# Patient Record
Sex: Female | Born: 1950 | Race: White | Hispanic: No
Health system: Midwestern US, Community
[De-identification: ages and names within clinical notes are randomized; demographics above are authoritative.]

## PROBLEM LIST (undated history)

## (undated) DIAGNOSIS — E039 Hypothyroidism, unspecified: Secondary | ICD-10-CM

## (undated) DIAGNOSIS — E079 Disorder of thyroid, unspecified: Secondary | ICD-10-CM

## (undated) DIAGNOSIS — F1020 Alcohol dependence, uncomplicated: Secondary | ICD-10-CM

## (undated) DIAGNOSIS — D649 Anemia, unspecified: Secondary | ICD-10-CM

## (undated) DIAGNOSIS — J449 Chronic obstructive pulmonary disease, unspecified: Secondary | ICD-10-CM

## (undated) DIAGNOSIS — H409 Unspecified glaucoma: Secondary | ICD-10-CM

## (undated) DIAGNOSIS — I10 Essential (primary) hypertension: Secondary | ICD-10-CM

## (undated) DIAGNOSIS — F32A Depression, unspecified: Secondary | ICD-10-CM

## (undated) DIAGNOSIS — C50919 Malignant neoplasm of unspecified site of unspecified female breast: Secondary | ICD-10-CM

## (undated) DIAGNOSIS — F418 Other specified anxiety disorders: Secondary | ICD-10-CM

## (undated) HISTORY — DX: Unspecified glaucoma: H40.9

## (undated) HISTORY — PX: TOTAL ABDOMINAL HYSTERECTOMY: SHX209

## (undated) HISTORY — DX: Alcohol dependence, uncomplicated: F10.20

## (undated) HISTORY — DX: Disorder of thyroid, unspecified: E07.9

## (undated) HISTORY — DX: Hypothyroidism, unspecified: E03.9

## (undated) HISTORY — DX: Depression, unspecified: F32.A

## (undated) HISTORY — DX: Essential (primary) hypertension: I10

## (undated) HISTORY — DX: Malignant neoplasm of unspecified site of unspecified female breast: C50.919

## (undated) HISTORY — DX: Chronic obstructive pulmonary disease, unspecified: J44.9

---

## 1985-11-06 HISTORY — PX: MASTECTOMY: SHX3

## 2011-11-29 MED ORDER — CLARITHROMYCIN ER 500 MG PO TB24
500 MG | ORAL_TABLET | Freq: Every day | ORAL | Status: AC
Start: 2011-11-29 — End: 2011-12-13

## 2011-11-29 MED ORDER — PROMETHAZINE-CODEINE 6.25-10 MG/5ML PO SYRP
Freq: Every evening | ORAL | Status: AC | PRN
Start: 2011-11-29 — End: 2011-12-06

## 2011-11-29 NOTE — Progress Notes (Signed)
Subjective:      Patient ID: Kelli Cole is a 60 y.o. female.    Cough  This is a recurrent problem. The current episode started 1 to 4 weeks ago. The problem has been unchanged. The problem occurs constantly. The cough is non-productive. Associated symptoms include chest pain, ear congestion, nasal congestion, a sore throat and shortness of breath. Pertinent negatives include no chills, fever, myalgias, rash or wheezing. The treatment provided mild relief. Her past medical history is significant for COPD and pneumonia. There is no history of asthma.       Review of Systems   Constitutional: Negative for fever, chills, activity change and fatigue.   HENT: Positive for sore throat. Negative for congestion.    Respiratory: Positive for cough and shortness of breath. Negative for chest tightness and wheezing.    Cardiovascular: Positive for chest pain. Negative for leg swelling.   Gastrointestinal: Negative for abdominal pain.   Musculoskeletal: Negative for myalgias and back pain.   Skin: Negative for rash.   Psychiatric/Behavioral: Negative for behavioral problems and dysphoric mood.       Objective:   Physical Exam   Constitutional: She is oriented to person, place, and time. She appears well-developed and well-nourished. No distress.   HENT:   Head: Normocephalic and atraumatic.   Right Ear: External ear normal.   Left Ear: External ear normal.   Nose: Mucosal edema (and erythema) present.   Mouth/Throat: Posterior oropharyngeal edema and posterior oropharyngeal erythema present. No oropharyngeal exudate.   Eyes: Conjunctivae are normal. Pupils are equal, round, and reactive to light. Right eye exhibits no discharge. Left eye exhibits no discharge.   Neck: Normal range of motion. Neck supple.   Cardiovascular: Normal rate, regular rhythm and normal heart sounds.  Exam reveals no gallop and no friction rub.    No murmur heard.  Pulmonary/Chest: Effort normal and breath sounds normal. No stridor. No respiratory  distress. She has no wheezes. She has no rales.   Breath sounds generally diminished   Lymphadenopathy:     She has no cervical adenopathy.   Neurological: She is alert and oriented to person, place, and time.   Skin: Skin is warm and dry. No rash noted. She is not diaphoretic. No erythema.       Assessment:      Kelli Cole was seen today for cough.    Diagnoses and associated orders for this visit:    Bronchitis    COPD (chronic obstructive pulmonary disease)    Other Orders  - clarithromycin (BIAXIN XL) 500 MG XL tablet; Take 2 tablets by mouth daily for 14 days.  - promethazine-codeine (PHENERGAN WITH CODEINE) 6.25-10 MG/5ML syrup; Take 10 mLs by mouth nightly as needed for Cough for 7 days.               Plan:      ENT referral if not all clear at 3 week f-u.

## 2011-12-12 MED ORDER — PREDNISONE 10 MG PO TABS
10 MG | ORAL_TABLET | Freq: Every day | ORAL | Status: AC
Start: 2011-12-12 — End: 2011-12-17

## 2012-02-11 MED ORDER — TIOTROPIUM BROMIDE MONOHYDRATE 18 MCG IN CAPS
18 MCG | ORAL_CAPSULE | Freq: Every day | RESPIRATORY_TRACT | Status: DC
Start: 2012-02-11 — End: 2012-07-04

## 2012-02-11 MED ORDER — LEVOFLOXACIN 500 MG PO TABS
500 MG | ORAL_TABLET | Freq: Every day | ORAL | Status: AC
Start: 2012-02-11 — End: 2012-02-21

## 2012-02-11 MED ORDER — PROMETHAZINE-CODEINE 6.25-10 MG/5ML PO SYRP
Freq: Every evening | ORAL | Status: AC | PRN
Start: 2012-02-11 — End: 2012-02-18

## 2012-02-11 MED ORDER — PREDNISONE 10 MG PO TABS
10 MG | ORAL_TABLET | Freq: Every day | ORAL | Status: AC
Start: 2012-02-11 — End: 2012-02-16

## 2012-02-11 NOTE — Progress Notes (Signed)
Subjective:      Patient ID: Kelli Cole is a 61 y.o. female.    Cough  This is a recurrent problem. The current episode started 1 to 4 weeks ago. The problem has been gradually worsening. The problem occurs constantly. The cough is productive of purulent sputum. Associated symptoms include chest pain, ear congestion, nasal congestion and wheezing. Pertinent negatives include no fever or shortness of breath. Risk factors for lung disease include smoking/tobacco exposure. Treatments tried: quit flovent from late last year. Her past medical history is significant for asthma and COPD.   Wheezing   Associated symptoms include chest pain and coughing. Pertinent negatives include no fever or shortness of breath. Her past medical history is significant for asthma and COPD.       Review of Systems   Constitutional: Positive for activity change (exhausted again). Negative for fever.   Respiratory: Positive for cough and wheezing. Negative for shortness of breath.    Cardiovascular: Positive for chest pain.       Objective:   Physical Exam   Constitutional: She is oriented to person, place, and time. She appears well-developed and well-nourished. No distress.   HENT:   Head: Normocephalic and atraumatic.   Right Ear: External ear normal.   Left Ear: External ear normal.   Nose: Mucosal edema (and erythema) present.   Mouth/Throat: Posterior oropharyngeal edema and posterior oropharyngeal erythema present. No oropharyngeal exudate.   Eyes: Conjunctivae are normal. Pupils are equal, round, and reactive to light. Right eye exhibits no discharge. Left eye exhibits no discharge.   Neck: Normal range of motion. Neck supple.   Cardiovascular: Normal rate, regular rhythm and normal heart sounds.  Exam reveals no gallop and no friction rub.    No murmur heard.  Pulmonary/Chest: Effort normal. No stridor. No respiratory distress. She has wheezes (few with few rhonchi). She has no rales.   Lymphadenopathy:     She has no cervical  adenopathy.   Neurological: She is alert and oriented to person, place, and time.   Skin: Skin is warm and dry. No rash noted. She is not diaphoretic. No erythema.   Psychiatric: Judgment normal. Her mood appears anxious. Cognition and memory are normal. She exhibits a depressed mood.       Assessment:      Yassmin was seen today for cough and wheezing.    Diagnoses and associated orders for this visit:    Cough  - X-ray chest PA and lateral    COPD (chronic obstructive pulmonary disease)    Depression with anxiety    Other Orders  - levofloxacin (LEVAQUIN) 500 MG tablet; Take 1 tablet by mouth daily for 10 days.  - predniSONE (DELTASONE) 10 MG tablet; Take 5 tablets by mouth daily for 5 days.  - tiotropium (SPIRIVA HANDIHALER) 18 MCG inhalation capsule; Inhale 1 capsule into the lungs daily.  - promethazine-codeine (PHENERGAN WITH CODEINE) 6.25-10 MG/5ML syrup; Take 10 mLs by mouth nightly as needed for Cough for 7 days.               Plan:      Meds as above.  CXR ordered get in next week or so.  F-u 3-4 weeks to make sure resolved, encourage continue spiriva, says flovent did not work.  Consider pulm referral, has ent f-u in mid April.

## 2012-03-11 NOTE — Progress Notes (Signed)
Subjective:      Patient ID: Kelli Cole is a 61 y.o. female.    Cough  This is a recurrent problem. The current episode started 1 to 4 weeks ago. The problem has been unchanged. The problem occurs every few minutes. Associated symptoms include ear congestion, headaches and nasal congestion. Pertinent negatives include no chest pain, chills, fever, myalgias, rash, shortness of breath or wheezing. Her past medical history is significant for COPD.       Review of Systems   Constitutional: Negative for fever, chills, activity change and fatigue.   HENT: Positive for congestion.    Respiratory: Positive for cough. Negative for chest tightness, shortness of breath and wheezing.    Cardiovascular: Negative for chest pain and leg swelling.   Gastrointestinal: Negative for abdominal pain.   Musculoskeletal: Negative for myalgias and back pain.   Skin: Negative for rash.   Neurological: Positive for headaches.   Psychiatric/Behavioral: Negative for behavioral problems and dysphoric mood.       Objective:   Physical Exam   Constitutional: She is oriented to person, place, and time. She appears well-developed and well-nourished. No distress.   HENT:   Head: Normocephalic and atraumatic.   Right Ear: External ear normal.   Left Ear: External ear normal.   Nose: Mucosal edema (and erythema) present.   Mouth/Throat: Posterior oropharyngeal edema and posterior oropharyngeal erythema present. No oropharyngeal exudate.   Eyes: Conjunctivae are normal. Pupils are equal, round, and reactive to light. Right eye exhibits no discharge. Left eye exhibits no discharge.   Neck: Normal range of motion. Neck supple.   Cardiovascular: Normal rate, regular rhythm and normal heart sounds.  Exam reveals no gallop and no friction rub.    No murmur heard.  Pulmonary/Chest: Effort normal and breath sounds normal. No stridor. No respiratory distress. She has no wheezes. She has no rales.   Lymphadenopathy:     She has no cervical adenopathy.    Neurological: She is alert and oriented to person, place, and time.   Skin: Skin is warm and dry. No rash noted. She is not diaphoretic. No erythema.       Assessment:      [name] was seen today for cough and nasal congestion.    Diagnoses and associated orders for this visit:    Sinusitis  - Ambulatory referral to ENT    Bronchitis    Depression    Other Orders  - simvastatin (ZOCOR) 40 MG tablet; Take 40 mg by mouth nightly.    - escitalopram (LEXAPRO) 20 MG tablet; Take 20 mg by mouth daily.    - risperiDONE (RISPERDAL) 3 MG tablet; Take 3 mg by mouth 2 times daily.    - raloxifene (EVISTA) 60 MG tablet; Take 60 mg by mouth daily.    - busPIRone (BUSPAR) 10 MG tablet; Take 10 mg by mouth 4 times daily.    - traZODone (DESYREL) 300 MG tablet; Take 300 mg by mouth nightly.    - Discontinue: FLOVENT HFA 110 MCG/ACT inhaler;   - traZODone (DESYREL) 150 MG tablet;   - predniSONE (DELTASONE) 10 MG tablet; Take 5 tablets by mouth daily for 5 days.               Plan:      [name] was seen today for cough and nasal congestion.    Diagnoses and associated orders for this visit:    Sinusitis  - Ambulatory referral to ENT    Bronchitis  Depression    Other Orders  - simvastatin (ZOCOR) 40 MG tablet; Take 40 mg by mouth nightly.    - escitalopram (LEXAPRO) 20 MG tablet; Take 20 mg by mouth daily.    - risperiDONE (RISPERDAL) 3 MG tablet; Take 3 mg by mouth 2 times daily.    - raloxifene (EVISTA) 60 MG tablet; Take 60 mg by mouth daily.    - busPIRone (BUSPAR) 10 MG tablet; Take 10 mg by mouth 4 times daily.    - traZODone (DESYREL) 300 MG tablet; Take 300 mg by mouth nightly.    - Discontinue: FLOVENT HFA 110 MCG/ACT inhaler;   - traZODone (DESYREL) 150 MG tablet;   - predniSONE (DELTASONE) 10 MG tablet; Take 5 tablets by mouth daily for 5 days.

## 2012-03-31 NOTE — Progress Notes (Signed)
Subjective:      Patient ID: Kelli Cole is a 61 y.o. female.    Cough  This is a chronic problem. The current episode started more than 1 month ago. The problem has been unchanged. The problem occurs every few minutes. The cough is non-productive. Pertinent negatives include no chest pain, ear congestion, fever, headaches, nasal congestion, rhinorrhea, sore throat, shortness of breath or wheezing. Associated symptoms comments: ENT has treated allergic rhinitis with nasonex with improvement ur sx but not cough. The treatment provided mild relief. Her past medical history is significant for COPD. There is no history of environmental allergies. quit smoking 5 years ago       Review of Systems   Constitutional: Negative for fever, activity change and fatigue.   HENT: Negative for congestion, sore throat, rhinorrhea, sneezing, neck pain and sinus pressure.    Eyes: Negative for discharge.   Respiratory: Positive for cough. Negative for shortness of breath and wheezing.    Cardiovascular: Negative for chest pain.   Musculoskeletal: Negative for arthralgias.   Skin: Negative for color change.   Allergic/Immunologic: Negative for environmental allergies.   Neurological: Negative for headaches.   Psychiatric/Behavioral: The patient is not nervous/anxious.        Objective:   Physical Exam   Constitutional: She is oriented to person, place, and time. She appears well-developed and well-nourished. No distress.   HENT:   Head: Normocephalic and atraumatic.   Right Ear: External ear normal.   Left Ear: External ear normal.   Mouth/Throat: Oropharynx is clear and moist.   Eyes: Conjunctivae are normal. Pupils are equal, round, and reactive to light. Right eye exhibits no discharge. Left eye exhibits no discharge.   Neck: Normal range of motion. Neck supple. No thyromegaly present.   Cardiovascular: Normal rate, regular rhythm and normal heart sounds.  Exam reveals no gallop.    No murmur heard.  Pulmonary/Chest: Effort normal  and breath sounds normal. No respiratory distress. She has no wheezes. She has no rales.   Abdominal: Soft. Bowel sounds are normal. She exhibits no distension.   Scaphoid abdomen   Musculoskeletal: She exhibits no edema.   Lymphadenopathy:     She has no cervical adenopathy.   Neurological: She is alert and oriented to person, place, and time.   Skin: Skin is warm and dry. No rash noted. She is not diaphoretic. No erythema.   Psychiatric: She has a normal mood and affect. Her behavior is normal.   BP 98/64  Pulse 110  Wt 110 lb 3.2 oz (49.986 kg)  BMI 16.76 kg/m2  SpO2 98%     WEIGHT UP 4 POUNDS IN 6 WEEKS!  Assessment:      Assessment  Kelli Cole was seen today for cough.    Diagnoses and associated orders for this visit:    Cough  - Ambulatory referral to Pulmonology    COPD (chronic obstructive pulmonary disease)  - Ambulatory referral to Pulmonology    Depression with anxiety    Other Orders  - NASONEX 50 MCG/ACT nasal spray;                Plan:      No change to meds.  Continue same.  Referred to pulm for assistance, may need additional inhaler, may need ct, plain films show copd no acute.  Recheck here 3 months with fbw.

## 2012-07-04 MED ORDER — MOMETASONE FUROATE 50 MCG/ACT NA SUSP
50 MCG/ACT | Freq: Every day | NASAL | Status: DC
Start: 2012-07-04 — End: 2013-07-09

## 2012-07-04 MED ORDER — TIOTROPIUM BROMIDE MONOHYDRATE 18 MCG IN CAPS
18 MCG | ORAL_CAPSULE | Freq: Every day | RESPIRATORY_TRACT | Status: DC
Start: 2012-07-04 — End: 2013-07-09

## 2012-07-05 LAB — COMPREHENSIVE METABOLIC PANEL
ALT: 13 U/L (ref 10–40)
AST: 22 U/L (ref 15–37)
Albumin/Globulin Ratio: 1.4 (ref 1.1–2.2)
Albumin: 3.9 g/dL (ref 3.4–5.0)
Alkaline Phosphatase: 48 U/L (ref 45–129)
BUN: 13 mg/dL (ref 7–18)
CO2: 31 mEq/L (ref 21–32)
Calcium: 9.3 mg/dL (ref 8.3–10.6)
Chloride: 104 mEq/L (ref 99–110)
Creatinine: 0.9 mg/dL (ref 0.6–1.2)
GFR African American: >60 (ref >60)
GFR Non-African American: >60 (ref >60)
Globulin: 3 g/dL
Glucose: 80 mg/dL (ref 70–99)
Potassium: 3.9 mEq/L (ref 3.5–5.1)
Sodium: 142 mEq/L (ref 136–145)
Total Bilirubin: 0.4 mg/dL (ref 0.00–1.00)
Total Protein: 6.7 g/dL (ref 6.4–8.2)

## 2012-07-05 LAB — CBC
Hematocrit: 39.1 % (ref 36.0–48.0)
Hemoglobin: 12.5 g/dL (ref 12.0–16.0)
MCH: 30.2 pg (ref 26.0–34.0)
MCHC: 32 g/dL (ref 31.0–36.0)
MCV: 94.5 fL (ref 80.0–100.0)
MPV: 10.4 fL (ref 5.0–10.5)
Platelets: 219 10*3/uL (ref 135–450)
RBC: 4.14 M/uL (ref 4.00–5.20)
RDW: 14.5 % (ref 12.4–15.4)
WBC: 5.9 10*3/uL (ref 4.0–11.0)

## 2012-07-05 LAB — LIPID PANEL
Cholesterol, Total: 162 mg/dL (ref 0–199)
HDL: 65 mg/dL — ABNORMAL HIGH (ref 40–60)
LDL Calculated: 84 mg/dL (ref 0–99)
Triglycerides: 63 mg/dL (ref 0–149)
VLDL Cholesterol Calculated: 13 mg/dL

## 2012-07-05 LAB — TSH: TSH: 5.25 u[IU]/mL (ref 0.35–5.50)

## 2012-07-06 LAB — VITAMIN D 25 HYDROXY: Vit D, 25-Hydroxy: 8 ng/mL — ABNORMAL LOW (ref 30–80)

## 2012-07-15 MED ORDER — LEVOTHYROXINE SODIUM 125 MCG PO TABS
125 MCG | ORAL_TABLET | Freq: Every day | ORAL | Status: DC
Start: 2012-07-15 — End: 2012-09-04

## 2012-07-15 MED ORDER — VITAMIN D (ERGOCALCIFEROL) 1.25 MG (50000 UT) PO CAPS
1.25 MG (50000 UT) | ORAL_CAPSULE | ORAL | Status: DC
Start: 2012-07-15 — End: 2012-09-04

## 2012-07-16 NOTE — Progress Notes (Signed)
Subjective:      Patient ID: Kelli Cole is a 61 y.o. female.    HPI Comments: Patient presents with:    COPD - Pt here for f/u copd and fbw    Hyperlipidemia - on meds, doing ok, labs today    Manic Behavior - on meds with psych, says ok, copy labs to [facility]          Review of Systems   Constitutional: Positive for fatigue. Negative for fever, chills and activity change.   HENT: Negative for congestion.    Respiratory: Positive for cough. Negative for chest tightness, shortness of breath and wheezing.    Cardiovascular: Negative for chest pain and leg swelling.   Gastrointestinal: Negative for abdominal pain.   Musculoskeletal: Negative for myalgias and back pain.   Skin: Negative for rash.   Psychiatric/Behavioral: Negative for behavioral problems and dysphoric mood (doing well).       Objective:   Physical Exam   Nursing note and vitals reviewed.  Constitutional: She is oriented to person, place, and time. She appears well-developed and well-nourished. No distress.   HENT:   Head: Normocephalic.   Mouth/Throat: Oropharynx is clear and moist.   Eyes: Conjunctivae are normal. Pupils are equal, round, and reactive to light. Right eye exhibits no discharge. Left eye exhibits no discharge.   Neck: Normal range of motion.   Cardiovascular: Normal rate, regular rhythm and normal heart sounds.    No murmur heard.  Pulmonary/Chest: Effort normal and breath sounds normal. No respiratory distress. She has no wheezes. She has no rales.   Neurological: She is alert and oriented to person, place, and time.   Skin: Skin is warm and dry. She is not diaphoretic.   Psychiatric: She has a normal mood and affect. Her behavior is normal.     BP 106/80   Pulse 102   Wt 118 lb 10.7 oz (53.828 kg)   BMI 18.05 kg/m2   SpO2 97%    Assessment:      Kelli Cole was seen today for copd, hyperlipidemia and manic behavior.    Diagnoses and associated orders for this visit:    Hyperlipidemia  - Lipid panel  - Comprehensive metabolic  panel    Depression with anxiety    COPD (chronic obstructive pulmonary disease)    Fatigue  - Comprehensive metabolic panel  - TSH  - Vitamin D 25 hydroxy  - CBC    Encounter for long-term (current) use of other medications  - CBC    Sinus headache    Other Orders  - tiotropium (SPIRIVA HANDIHALER) 18 MCG inhalation capsule; Inhale 1 capsule into the lungs daily.  - mometasone (NASONEX) 50 MCG/ACT nasal spray; 2 sprays by Nasal route daily.             Plan:      Labs and meds today.  WIll send copy of labs to [facility].  Recheck quarterly.

## 2012-08-05 MED ORDER — SIMVASTATIN 40 MG PO TABS
40 MG | ORAL_TABLET | Freq: Every evening | ORAL | Status: DC
Start: 2012-08-05 — End: 2012-09-04

## 2012-08-20 NOTE — Telephone Encounter (Signed)
Pended medication for Escitalopram 20 mg

## 2012-08-21 MED ORDER — ESCITALOPRAM OXALATE 20 MG PO TABS
20 MG | ORAL_TABLET | Freq: Every day | ORAL | Status: DC
Start: 2012-08-21 — End: 2012-09-04

## 2012-08-21 NOTE — Telephone Encounter (Signed)
rx sent

## 2012-09-04 MED ORDER — PROMETHAZINE-CODEINE 6.25-10 MG/5ML PO SYRP
Freq: Every evening | ORAL | Status: AC | PRN
Start: 2012-09-04 — End: 2012-09-11

## 2012-09-04 MED ORDER — RALOXIFENE HCL 60 MG PO TABS
60 MG | ORAL_TABLET | Freq: Every day | ORAL | Status: DC
Start: 2012-09-04 — End: 2013-09-07

## 2012-09-04 MED ORDER — RALOXIFENE HCL 60 MG PO TABS
60 MG | ORAL_TABLET | Freq: Every day | ORAL | Status: DC
Start: 2012-09-04 — End: 2012-09-04

## 2012-09-04 MED ORDER — SIMVASTATIN 40 MG PO TABS
40 MG | ORAL_TABLET | Freq: Every evening | ORAL | Status: DC
Start: 2012-09-04 — End: 2013-01-16

## 2012-09-04 MED ORDER — LEVOTHYROXINE SODIUM 125 MCG PO TABS
125 MCG | ORAL_TABLET | Freq: Every day | ORAL | Status: DC
Start: 2012-09-04 — End: 2012-11-10

## 2012-09-04 MED ORDER — RISPERIDONE 3 MG PO TABS
3 MG | ORAL_TABLET | Freq: Every day | ORAL | Status: DC
Start: 2012-09-04 — End: 2013-09-07

## 2012-09-04 MED ORDER — ESCITALOPRAM OXALATE 20 MG PO TABS
20 MG | ORAL_TABLET | Freq: Every day | ORAL | Status: DC
Start: 2012-09-04 — End: 2013-12-02

## 2012-09-04 MED ORDER — VITAMIN D (ERGOCALCIFEROL) 1.25 MG (50000 UT) PO CAPS
1.25 MG (50000 UT) | ORAL_CAPSULE | ORAL | Status: DC
Start: 2012-09-04 — End: 2013-03-10

## 2012-09-04 MED ORDER — SIMVASTATIN 40 MG PO TABS
40 MG | ORAL_TABLET | Freq: Every evening | ORAL | Status: DC
Start: 2012-09-04 — End: 2012-09-04

## 2012-09-04 MED ORDER — ESCITALOPRAM OXALATE 20 MG PO TABS
20 MG | ORAL_TABLET | Freq: Every day | ORAL | Status: DC
Start: 2012-09-04 — End: 2012-09-04

## 2012-09-04 MED ORDER — RISPERIDONE 3 MG PO TABS
3 MG | ORAL_TABLET | Freq: Every day | ORAL | Status: DC
Start: 2012-09-04 — End: 2012-09-04

## 2012-09-04 NOTE — Telephone Encounter (Signed)
[facility]20[facility], faxed to [phone], pt aware.  gina

## 2012-09-04 NOTE — Progress Notes (Signed)
Subjective:      Patient ID: Ameilia Hirakawa is a 61 y.o. female.    HPI Comments: Patient presents with:    Depression - Pt here for f/u depression, needs some refills, moved in with son; says mood is good, family has reported    COPD - Pt here f/u copd, no breathing problems lately    With questioning admits to cold sx.  No fever or congestion, for 3 weeks getting better.        Review of Systems   Constitutional: Negative for fever, chills, activity change and fatigue.   HENT: Positive for sneezing and sinus pressure. Negative for congestion.    Respiratory: Positive for cough. Negative for chest tightness, shortness of breath and wheezing.    Cardiovascular: Negative for chest pain and leg swelling.   Gastrointestinal: Negative for abdominal pain.   Musculoskeletal: Negative for myalgias and back pain.   Skin: Negative for rash.   Psychiatric/Behavioral: Negative for behavioral problems and dysphoric mood.       Objective:   Physical Exam   Nursing note and vitals reviewed.  Constitutional: She appears well-developed and well-nourished. No distress.   HENT:   Head: Normocephalic and atraumatic.   Right Ear: External ear normal.   Left Ear: External ear normal.   Nose: No mucosal edema.   Mouth/Throat: Posterior oropharyngeal edema and posterior oropharyngeal erythema present. No oropharyngeal exudate.   Eyes: Conjunctivae and EOM are normal. Pupils are equal, round, and reactive to light. Right eye exhibits no discharge.   Neck: Normal range of motion.   Cardiovascular: Normal rate, regular rhythm, normal heart sounds and intact distal pulses.  Exam reveals no gallop.    No murmur heard.  Pulmonary/Chest: Effort normal and breath sounds normal. She has no wheezes. She has no rales.   Lymphadenopathy:     She has no cervical adenopathy.   Skin: Skin is warm and dry. No rash noted. She is not diaphoretic.   Psychiatric: Her behavior is normal. Her mood appears anxious. She exhibits a depressed mood.   Mild anx and  depression only; she is freshly showered today     BP 96/54   Pulse 123   Wt 122 lb 9.6 oz (55.611 kg)   BMI 18.65 kg/m2   SpO2 99%   Breastfeeding? No    Assessment:      Italie was seen today for depression and copd.    Diagnoses and associated orders for this visit:    Depression with anxiety    COPD (chronic obstructive pulmonary disease)  - 94760 - PR NONINVASV OXYGEN SATUR;SINGLE    Encounter for long-term (current) use of other medications  - 94760 - PR NONINVASV OXYGEN SATUR;SINGLE    Need for prophylactic vaccination and inoculation against influenza  - Flu Vaccine greater than or = 62yo IM    Other Orders  - Discontinue: escitalopram (LEXAPRO) 20 MG tablet; Take 1 tablet by mouth daily.  - Discontinue: raloxifene (EVISTA) 60 MG tablet; Take 1 tablet by mouth daily.  - Discontinue: simvastatin (ZOCOR) 40 MG tablet; Take 1 tablet by mouth nightly.  - Discontinue: risperiDONE (RISPERDAL) 3 MG tablet; Take 1 tablet by mouth daily.  - levothyroxine (SYNTHROID) 125 MCG tablet; Take 1 tablet by mouth Daily.  - vitamin D (ERGOCALCIFEROL) 50000 UNITS CAPS capsule; Take 1 capsule by mouth once a week.  - escitalopram (LEXAPRO) 20 MG tablet; Take 1 tablet by mouth daily.  - raloxifene (EVISTA) 60 MG tablet; Take 1  tablet by mouth daily.  - simvastatin (ZOCOR) 40 MG tablet; Take 1 tablet by mouth nightly.  - risperiDONE (RISPERDAL) 3 MG tablet; Take 1 tablet by mouth daily.             Plan:      Newly on synthroid and vit d, will need recheck in few months.  Has meds.  Moved in with son most meds rewritten and sent to [facility].  Has cold sx with resolving cough, but still bothersome at night.  Cough med rax also given, see if not better. Flu vaccine today.     Return in about 2 months (around 11/04/2012) for recheck thyroid an vit d md visit.

## 2012-09-10 NOTE — Telephone Encounter (Signed)
[facility], faxed to [phone], pt aware, as per Diane.  gina

## 2012-11-05 NOTE — Progress Notes (Signed)
Chief Complaint   Patient presents with   ??? COPD     Pt here for 2 mo copd ch and FBW, denies any complaints   ??? Anxiety     says mood is good but is very wiggly today, will sit on table for me but won't hold still, laughs nervously but tells me everything is ok--living with son and daughter-in-law     Review of Systems   Constitutional: Negative for fever and malaise/fatigue.   HENT: Negative for congestion (no major sinus issues at this time).    Eyes: Negative for blurred vision.   Respiratory: Positive for cough (some).    Cardiovascular: Negative for chest pain and leg swelling.   Gastrointestinal: Negative for abdominal pain.   Musculoskeletal: Negative for myalgias.   Skin: Negative for rash.   Neurological: Negative for headaches.   Psychiatric/Behavioral: Negative for depression. The patient is nervous/anxious (mild).          Outpatient Prescriptions Marked as Taking for the 11/05/12 encounter (Office Visit) with Merita Norton, MD   Medication Sig Dispense Refill   ??? levothyroxine (SYNTHROID) 125 MCG tablet Take 1 tablet by mouth Daily.  30 tablet  5   ??? vitamin D (ERGOCALCIFEROL) 50000 UNITS CAPS capsule Take 1 capsule by mouth once a week.  5 capsule  5   ??? escitalopram (LEXAPRO) 20 MG tablet Take 1 tablet by mouth daily.  30 tablet  12   ??? raloxifene (EVISTA) 60 MG tablet Take 1 tablet by mouth daily.  30 tablet  12   ??? simvastatin (ZOCOR) 40 MG tablet Take 1 tablet by mouth nightly.  30 tablet  12   ??? risperiDONE (RISPERDAL) 3 MG tablet Take 1 tablet by mouth daily.  30 tablet  12   ??? tiotropium (SPIRIVA HANDIHALER) 18 MCG inhalation capsule Inhale 1 capsule into the lungs daily.  30 capsule  12   ??? mometasone (NASONEX) 50 MCG/ACT nasal spray 2 sprays by Nasal route daily.  1 Inhaler  12   ??? busPIRone (BUSPAR) 10 MG tablet Take 10 mg by mouth 4 times daily.         ??? traZODone (DESYREL) 150 MG tablet            Filed Vitals:    11/05/12 0955   BP: 106/74   Pulse: 116     BP 106/74   Pulse 116   Wt 119 lb  3.2 oz (54.069 kg)   BMI 18.13 kg/m2   SpO2 99%      Physical Exam   Nursing note and vitals reviewed.  Constitutional: She is oriented to person, place, and time. She appears well-developed and well-nourished. No distress.   HENT:   Head: Normocephalic.   Mouth/Throat: Oropharynx is clear and moist.   Eyes: Conjunctivae are normal. Pupils are equal, round, and reactive to light. Right eye exhibits no discharge. Left eye exhibits no discharge.   Neck: Normal range of motion.   Cardiovascular: Normal rate, regular rhythm and normal heart sounds.    No murmur heard.  Pulmonary/Chest: Effort normal and breath sounds normal. No respiratory distress. She has no wheezes. She has no rales.   Neurological: She is alert and oriented to person, place, and time.   Skin: Skin is warm and dry. She is not diaphoretic.   Psychiatric: She has a normal mood and affect. Her behavior is normal.   Anxious, a bit hyperactive, nervous but not inappropriate  _________________________________________________  Assessment:     Terilynn was seen today for copd and anxiety.    Diagnoses and associated orders for this visit:    Unspecified hypothyroidism  - TSH    Encounter for long-term (current) use of other medications  - Vitamin D 25 hydroxy  - TSH    Vitamin D deficiency  - Vitamin D 25 hydroxy    COPD (chronic obstructive pulmonary disease)  - 94760 - PR NONINVASV OXYGEN SATUR;SINGLE    Depression with anxiety          _________________________________________________  Plan:     Labs as above.  No changes to meds for now.  Breast care UTD, s/p breast cancer and mastectomy.     Return in about 6 months (around 05/06/2013) for on all inc mood, etc.

## 2012-11-06 LAB — TSH: TSH: <0.01 u[IU]/mL — ABNORMAL LOW (ref 0.27–4.20)

## 2012-11-06 LAB — T4, FREE: T4 Free: 2.5 ng/ml — ABNORMAL HIGH (ref 0.9–1.8)

## 2012-11-07 LAB — VITAMIN D 25 HYDROXY: Vit D, 25-Hydroxy: 60 ng/mL (ref 30–80)

## 2012-11-10 MED ORDER — LEVOTHYROXINE SODIUM 100 MCG PO TABS
100 MCG | ORAL_TABLET | Freq: Every day | ORAL | Status: DC
Start: 2012-11-10 — End: 2013-02-17

## 2012-12-05 MED ORDER — AIRIAL COMPACT MINI NEBULIZER MISC
Freq: Four times a day (QID) | Status: DC
Start: 2012-12-05 — End: 2014-09-01

## 2012-12-05 MED ORDER — PREDNISONE 10 MG PO TABS
10 MG | ORAL_TABLET | Freq: Every day | ORAL | Status: AC
Start: 2012-12-05 — End: 2012-12-10

## 2012-12-05 MED ORDER — DOXYCYCLINE HYCLATE 100 MG PO TABS
100 MG | ORAL_TABLET | Freq: Two times a day (BID) | ORAL | Status: AC
Start: 2012-12-05 — End: 2012-12-15

## 2012-12-05 MED ORDER — ALBUTEROL SULFATE (2.5 MG/3ML) 0.083% IN NEBU
RESPIRATORY_TRACT | Status: DC | PRN
Start: 2012-12-05 — End: 2014-09-01

## 2012-12-16 NOTE — Telephone Encounter (Signed)
refell

## 2012-12-28 NOTE — Progress Notes (Signed)
Chief Complaint   Patient presents with   ??? Pneumonia     Pt here for pneumonia re ch, much improved; energy back close to normal, no major sinus sx   ??? COPD     back to baseline   ??? Anxiety     control is adequate, sees psych nurse     Review of Systems   Constitutional: Negative for fever and malaise/fatigue.   HENT: Negative for congestion.    Eyes: Negative for blurred vision.   Respiratory: Positive for cough (mild) and shortness of breath (mild only). Negative for sputum production and wheezing.    Cardiovascular: Negative for chest pain and leg swelling.   Gastrointestinal: Negative for abdominal pain.   Musculoskeletal: Negative for myalgias.   Skin: Negative for rash.   Neurological: Negative for headaches.   Psychiatric/Behavioral: Negative for depression. The patient is nervous/anxious (baseline, says doing ok).          Outpatient Prescriptions Marked as Taking for the 12/11/12 encounter (Office Visit) with Merita Norton, MD   Medication Sig Dispense Refill   ??? [EXPIRED] doxycycline (VIBRA-TABS) 100 MG tablet Take 1 tablet by mouth 2 times daily for 10 days.  20 tablet  0   ??? Nebulizers (AIRIAL COMPACT MINI NEBULIZER) MISC 1 each by Does not apply route 4 times daily.  1 each  0   ??? albuterol (PROVENTIL) (2.5 MG/3ML) 0.083% nebulizer solution Take 3 mLs by nebulization every 4 hours as needed for Wheezing.  25 vial  2   ??? levothyroxine (SYNTHROID) 100 MCG tablet Take 1 tablet by mouth daily.  30 tablet  5   ??? vitamin D (ERGOCALCIFEROL) 50000 UNITS CAPS capsule Take 1 capsule by mouth once a week.  5 capsule  5   ??? escitalopram (LEXAPRO) 20 MG tablet Take 1 tablet by mouth daily.  30 tablet  12   ??? raloxifene (EVISTA) 60 MG tablet Take 1 tablet by mouth daily.  30 tablet  12   ??? simvastatin (ZOCOR) 40 MG tablet Take 1 tablet by mouth nightly.  30 tablet  12   ??? risperiDONE (RISPERDAL) 3 MG tablet Take 1 tablet by mouth daily.  30 tablet  12   ??? tiotropium (SPIRIVA HANDIHALER) 18 MCG inhalation capsule Inhale  1 capsule into the lungs daily.  30 capsule  12   ??? mometasone (NASONEX) 50 MCG/ACT nasal spray 2 sprays by Nasal route daily.  1 Inhaler  12   ??? busPIRone (BUSPAR) 10 MG tablet Take 10 mg by mouth 4 times daily.         ??? traZODone (DESYREL) 150 MG tablet            Filed Vitals:    12/11/12 1145   BP: 100/68   Pulse: 117     Nursing note reviewed  BP 100/68   Pulse 117   Wt 116 lb (52.617 kg)   BMI 17.64 kg/m2   SpO2 95%  BP Readings from Last 3 Encounters:   12/11/12 100/68   12/05/12 98/52   11/05/12 106/74     Wt Readings from Last 3 Encounters:   12/11/12 116 lb (52.617 kg)   12/05/12 118 lb 3.2 oz (53.615 kg)   11/05/12 119 lb 3.2 oz (54.069 kg)     Body mass index is 17.64 kg/(m^2).  No results found for this visit on 12/11/12.    Physical Exam   Nursing note and vitals reviewed.  Constitutional: She is oriented to person,  place, and time. She appears well-developed and well-nourished. No distress.   Has lost a few pounds need to watch   HENT:   Head: Normocephalic.   Mouth/Throat: Oropharynx is clear and moist.   Eyes: Conjunctivae are normal. Pupils are equal, round, and reactive to light. Right eye exhibits no discharge. Left eye exhibits no discharge.   Neck: Normal range of motion.   Cardiovascular: Normal rate, regular rhythm and normal heart sounds.    No murmur heard.  Pulmonary/Chest: Effort normal and breath sounds normal. No respiratory distress. She has no wheezes. She has no rales.   Neurological: She is alert and oriented to person, place, and time.   Skin: Skin is warm and dry. She is not diaphoretic.   Psychiatric: Her behavior is normal. Her mood appears anxious. Her speech is slurred (minimal slurring today, has some problems d/t dry mouth). She exhibits a depressed mood (mild).         _________________________________________________  Assessment:     Kelli Cole was seen today for pneumonia, copd and anxiety.    Diagnoses and associated orders for this visit:    Bronchitis    COPD (chronic  obstructive pulmonary disease)    Depression with anxiety    Weight decrease    Other Orders  - Pulse Oximetry          _________________________________________________  Plan:     Doing better, stick with old meds no changes.  Continue inhaler prn.     Return in about 3 months (around 03/28/2013), or if symptoms worsen or fail to improve.

## 2012-12-28 NOTE — Progress Notes (Signed)
Chief Complaint   Patient presents with   ??? Cough     Pt has had cough for 2 wks, mucus not forthcoming, no fever   ??? Shortness of Breath     Pt has had sob for 2 wks, some mild wheezing       Review of Systems   Constitutional: Negative for fever and malaise/fatigue.   HENT: Positive for congestion.    Eyes: Negative for blurred vision.   Respiratory: Positive for cough, shortness of breath and wheezing (some). Negative for sputum production.    Cardiovascular: Negative for chest pain and leg swelling.   Gastrointestinal: Negative for abdominal pain.   Musculoskeletal: Negative for myalgias.   Skin: Negative for rash.   Neurological: Negative for headaches.   Psychiatric/Behavioral: Positive for depression (mood fair, living with son now). The patient is nervous/anxious.          Outpatient Prescriptions Marked as Taking for the 12/05/12 encounter (Office Visit) with Merita Norton, MD   Medication Sig Dispense Refill   ??? [EXPIRED] doxycycline (VIBRA-TABS) 100 MG tablet Take 1 tablet by mouth 2 times daily for 10 days.  20 tablet  0   ??? [EXPIRED] predniSONE (DELTASONE) 10 MG tablet Take 5 tablets by mouth daily for 5 days.  25 tablet  0   ??? Nebulizers (AIRIAL COMPACT MINI NEBULIZER) MISC 1 each by Does not apply route 4 times daily.  1 each  0   ??? albuterol (PROVENTIL) (2.5 MG/3ML) 0.083% nebulizer solution Take 3 mLs by nebulization every 4 hours as needed for Wheezing.  25 vial  2   ??? levothyroxine (SYNTHROID) 100 MCG tablet Take 1 tablet by mouth daily.  30 tablet  5   ??? vitamin D (ERGOCALCIFEROL) 50000 UNITS CAPS capsule Take 1 capsule by mouth once a week.  5 capsule  5   ??? escitalopram (LEXAPRO) 20 MG tablet Take 1 tablet by mouth daily.  30 tablet  12   ??? raloxifene (EVISTA) 60 MG tablet Take 1 tablet by mouth daily.  30 tablet  12   ??? simvastatin (ZOCOR) 40 MG tablet Take 1 tablet by mouth nightly.  30 tablet  12   ??? risperiDONE (RISPERDAL) 3 MG tablet Take 1 tablet by mouth daily.  30 tablet  12   ??? tiotropium  (SPIRIVA HANDIHALER) 18 MCG inhalation capsule Inhale 1 capsule into the lungs daily.  30 capsule  12   ??? mometasone (NASONEX) 50 MCG/ACT nasal spray 2 sprays by Nasal route daily.  1 Inhaler  12   ??? busPIRone (BUSPAR) 10 MG tablet Take 10 mg by mouth 4 times daily.         ??? traZODone (DESYREL) 150 MG tablet            Filed Vitals:    12/05/12 0957   BP: 98/52   Pulse: 115   Temp: 97.7 ??F (36.5 ??C)     Nursing note reviewed  BP 98/52   Pulse 115   Temp(Src) 97.7 ??F (36.5 ??C)   Wt 118 lb 3.2 oz (53.615 kg)   BMI 17.98 kg/m2   SpO2 95%  BP Readings from Last 3 Encounters:   12/11/12 100/68   12/05/12 98/52   11/05/12 106/74     Wt Readings from Last 3 Encounters:   12/11/12 116 lb (52.617 kg)   12/05/12 118 lb 3.2 oz (53.615 kg)   11/05/12 119 lb 3.2 oz (54.069 kg)     Body mass index is  17.98 kg/(m^2).  No results found for this visit on 12/05/12.    Physical Exam   Nursing note and vitals reviewed.  Constitutional: She is oriented to person, place, and time. She appears well-developed and well-nourished. No distress.   HENT:   Head: Normocephalic and atraumatic.   Right Ear: External ear normal.   Left Ear: External ear normal.   Nose: Mucosal edema (and erythema) present.   Mouth/Throat: Posterior oropharyngeal edema and posterior oropharyngeal erythema present. No oropharyngeal exudate.   Eyes: Conjunctivae are normal. Pupils are equal, round, and reactive to light. Right eye exhibits no discharge. Left eye exhibits no discharge.   Neck: Normal range of motion. Neck supple.   Cardiovascular: Normal rate, regular rhythm and normal heart sounds.  Exam reveals no gallop and no friction rub.    No murmur heard.  Pulmonary/Chest: Effort normal and breath sounds normal. No stridor. No respiratory distress. She has no wheezes. She has no rales.   Few rhonchi   Lymphadenopathy:     She has no cervical adenopathy.   Neurological: She is alert and oriented to person, place, and time.   Skin: Skin is warm and dry. No rash  noted. She is not diaphoretic. No erythema.   Psychiatric: Her speech is normal and behavior is normal. Judgment and thought content normal. Her mood appears anxious. Cognition and memory are normal. She exhibits a depressed mood.         _________________________________________________  Assessment:     Lidia was seen today for cough and shortness of breath.    Diagnoses and associated orders for this visit:    COPD (chronic obstructive pulmonary disease)  - PR NONINVASV OXYGEN SATUR;SINGLE    Bronchitis  - PR NONINVASV OXYGEN SATUR;SINGLE    Depression with anxiety    Other Orders  - doxycycline (VIBRA-TABS) 100 MG tablet; Take 1 tablet by mouth 2 times daily for 10 days.  - predniSONE (DELTASONE) 10 MG tablet; Take 5 tablets by mouth daily for 5 days.  - Nebulizers (AIRIAL COMPACT MINI NEBULIZER) MISC; 1 each by Does not apply route 4 times daily.  - albuterol (PROVENTIL) (2.5 MG/3ML) 0.083% nebulizer solution; Take 3 mLs by nebulization every 4 hours as needed for Wheezing.          _________________________________________________  Plan:     Meds as above.  Rest inside (weather very cold).  TO ER if significantly worse.    Return in about 1 week (around 12/12/2012) for recheck breathing.

## 2013-01-16 MED ORDER — SIMVASTATIN 40 MG PO TABS
40 MG | ORAL_TABLET | Freq: Every evening | ORAL | Status: DC
Start: 2013-01-16 — End: 2014-02-02

## 2013-01-22 LAB — TSH: TSH: 0.02 u[IU]/mL — ABNORMAL LOW (ref 0.27–4.20)

## 2013-02-17 MED ORDER — LEVOTHYROXINE SODIUM 100 MCG PO TABS
100 MCG | ORAL_TABLET | Freq: Every day | ORAL | Status: DC
Start: 2013-02-17 — End: 2013-06-08

## 2013-03-11 MED ORDER — VITAMIN D (ERGOCALCIFEROL) 1.25 MG (50000 UT) PO CAPS
1.25 MG (50000 UT) | ORAL_CAPSULE | ORAL | Status: DC
Start: 2013-03-11 — End: 2013-09-10

## 2013-06-01 LAB — TSH: TSH: <0.01 u[IU]/mL — ABNORMAL LOW (ref 0.27–4.20)

## 2013-06-01 LAB — CBC WITH AUTO DIFFERENTIAL
Basophils %: 0.7 %
Basophils Absolute: 0 10*3/uL (ref 0.0–0.2)
Eosinophils %: 7.2 %
Eosinophils Absolute: 0.5 10*3/uL (ref 0.0–0.6)
Hematocrit: 39.1 % (ref 36.0–48.0)
Hemoglobin: 13 g/dL (ref 12.0–16.0)
Lymphocytes %: 30.5 %
Lymphocytes Absolute: 2.2 10*3/uL (ref 1.0–5.1)
MCH: 30.1 pg (ref 26.0–34.0)
MCHC: 33.1 g/dL (ref 31.0–36.0)
MCV: 90.9 fL (ref 80.0–100.0)
MPV: 11.4 fL — ABNORMAL HIGH (ref 5.0–10.5)
Monocytes %: 6.7 %
Monocytes Absolute: 0.5 10*3/uL (ref 0.0–1.3)
Neutrophils %: 54.9 %
Neutrophils Absolute: 3.9 10*3/uL (ref 1.7–7.7)
Platelets: 221 10*3/uL (ref 135–450)
RBC: 4.3 M/uL (ref 4.00–5.20)
RDW: 13.8 % (ref 12.4–15.4)
WBC: 7.1 10*3/uL (ref 4.0–11.0)

## 2013-06-01 LAB — LIPID PANEL
Cholesterol, Total: 146 mg/dL (ref 0–199)
HDL: 80 mg/dL — ABNORMAL HIGH (ref 40–60)
LDL Calculated: 54 mg/dL (ref <100)
Triglycerides: 62 mg/dL (ref 0–150)
VLDL Cholesterol Calculated: 12 mg/dL

## 2013-06-01 LAB — VITAMIN B12: Vitamin B-12: 633 pg/mL (ref 211–911)

## 2013-06-01 LAB — COMPREHENSIVE METABOLIC PANEL
ALT: 9 U/L — ABNORMAL LOW (ref 10–40)
AST: 15 U/L (ref 15–37)
Albumin/Globulin Ratio: 1.7 (ref 1.1–2.2)
Albumin: 4.1 g/dL (ref 3.4–5.0)
Alkaline Phosphatase: 55 U/L (ref 40–129)
BUN: 17 mg/dL (ref 7–20)
CO2: 24 mmol/L (ref 21–32)
Calcium: 9.6 mg/dL (ref 8.3–10.6)
Chloride: 104 mmol/L (ref 99–110)
Creatinine: 0.9 mg/dL (ref 0.6–1.2)
GFR African American: >60 (ref >60)
GFR Non-African American: >60 (ref >60)
Globulin: 2.4 g/dL
Glucose: 87 mg/dL (ref 70–99)
Potassium: 4.9 mmol/L (ref 3.5–5.1)
Sodium: 140 mmol/L (ref 136–145)
Total Bilirubin: 0.3 mg/dL (ref 0.0–1.0)
Total Protein: 6.5 g/dL (ref 6.4–8.2)

## 2013-06-01 MED ORDER — AZITHROMYCIN 250 MG PO TABS
250 MG | PACK | ORAL | Status: AC
Start: 2013-06-01 — End: 2013-06-11

## 2013-06-01 MED ORDER — PROMETHAZINE-CODEINE 6.25-10 MG/5ML PO SYRP
Freq: Every evening | ORAL | Status: AC | PRN
Start: 2013-06-01 — End: 2013-06-08

## 2013-06-01 NOTE — Progress Notes (Signed)
Chief Complaint   Patient presents with   ??? Cough     Pt has had cough for 2 wks, white phlegm, not feverish; known copd   ??? Depression     continues with significant depression and anx, seeing psych, social issues worse lives with son and daughter and law they are going through a difficult divorce and this patient will probably have to move   ??? Fatigue     patient complains of a lot of fatigue today as well       Review of Systems   Constitutional: Positive for malaise/fatigue. Negative for fever.   HENT: Negative for congestion.    Eyes: Negative for blurred vision.   Respiratory: Positive for cough and shortness of breath.    Cardiovascular: Negative for chest pain and leg swelling.   Gastrointestinal: Negative for abdominal pain.   Musculoskeletal: Negative for myalgias.   Skin: Negative for rash.   Neurological: Negative for headaches.   Psychiatric/Behavioral: Positive for depression. The patient is nervous/anxious.          Outpatient Prescriptions Marked as Taking for the 06/01/13 encounter (Office Visit) with Merita Norton, MD   Medication Sig Dispense Refill   ??? [EXPIRED] promethazine-codeine (PHENERGAN WITH CODEINE) 6.25-10 MG/5ML syrup Take 10 mLs by mouth nightly as needed for Cough for 7 days. May cause drowsiness.  120 mL  0   ??? [EXPIRED] azithromycin (ZITHROMAX Z-PAK) 250 MG tablet Take 2 tablets on day 1, then 1 tablet daily for the next 4 days.  1 packet  0   ??? vitamin D (ERGOCALCIFEROL) 50000 UNITS CAPS capsule Take 1 capsule by mouth once a week.  5 capsule  5   ??? [DISCONTINUED] levothyroxine (SYNTHROID) 100 MCG tablet Take 1 tablet by mouth daily.  30 tablet  5   ??? simvastatin (ZOCOR) 40 MG tablet Take 1 tablet by mouth nightly.  30 tablet  12   ??? Nebulizers (AIRIAL COMPACT MINI NEBULIZER) MISC 1 each by Does not apply route 4 times daily.  1 each  0   ??? albuterol (PROVENTIL) (2.5 MG/3ML) 0.083% nebulizer solution Take 3 mLs by nebulization every 4 hours as needed for Wheezing.  25 vial  2   ???  escitalopram (LEXAPRO) 20 MG tablet Take 1 tablet by mouth daily.  30 tablet  12   ??? raloxifene (EVISTA) 60 MG tablet Take 1 tablet by mouth daily.  30 tablet  12   ??? risperiDONE (RISPERDAL) 3 MG tablet Take 1 tablet by mouth daily.  30 tablet  12   ??? tiotropium (SPIRIVA HANDIHALER) 18 MCG inhalation capsule Inhale 1 capsule into the lungs daily.  30 capsule  12   ??? mometasone (NASONEX) 50 MCG/ACT nasal spray 2 sprays by Nasal route daily.  1 Inhaler  12   ??? busPIRone (BUSPAR) 10 MG tablet Take 10 mg by mouth 4 times daily.         ??? traZODone (DESYREL) 150 MG tablet            Nursing note reviewed  BP 96/60   Pulse 94   Temp(Src) 98.3 ??F (36.8 ??C)   Wt 119 lb (53.978 kg)   BMI 18.1 kg/m2   SpO2 98%   Filed Vitals:    06/01/13 1028   BP: 96/60   Pulse: 94   Temp: 98.3 ??F (36.8 ??C)   Weight: 119 lb (53.978 kg)   SpO2: 98%     BP Readings from Last 3 Encounters:  06/01/13 96/60   12/11/12 100/68   12/05/12 98/52     Wt Readings from Last 3 Encounters:   06/01/13 119 lb (53.978 kg)   12/11/12 116 lb (52.617 kg)   12/05/12 118 lb 3.2 oz (53.615 kg)     Body mass index is 18.1 kg/(m^2).  No results found for this visit on 06/01/13.        Physical Exam   Constitutional: She is oriented to person, place, and time. She appears well-developed and well-nourished. No distress.   HENT:   Head: Normocephalic.   Mouth/Throat: Oropharynx is clear and moist.   Eyes: Conjunctivae are normal. Pupils are equal, round, and reactive to light. Right eye exhibits no discharge. Left eye exhibits no discharge.   Neck: Normal range of motion.   Cardiovascular: Normal rate, regular rhythm and normal heart sounds.    No murmur heard.  Pulmonary/Chest: Effort normal and breath sounds normal. No respiratory distress. She has no wheezes. She has no rales.   Neurological: She is alert and oriented to person, place, and time.   Skin: Skin is warm and dry. She is not diaphoretic.   Psychiatric: She has a normal mood and affect. Her behavior is  normal.   Nursing note and vitals reviewed.        _________________________________________________  Assessment:     Nettie was seen today for cough, depression and fatigue.    Diagnoses and associated orders for this visit:    COPD (chronic obstructive pulmonary disease)  - promethazine-codeine (PHENERGAN WITH CODEINE) 6.25-10 MG/5ML syrup; Take 10 mLs by mouth nightly as needed for Cough for 7 days. May cause drowsiness.  - azithromycin (ZITHROMAX Z-PAK) 250 MG tablet; Take 2 tablets on day 1, then 1 tablet daily for the next 4 days.  - 94760 - PR NONINVASV OXYGEN SATUR;SINGLE    Depression with anxiety  - CBC Auto Differential    Vitamin D deficiency    Fatigue  - CBC Auto Differential  - Comprehensive metabolic panel  - Vitamin B12  - VITAMIN D 25 HYDROXY    Hyperlipidemia  - promethazine-codeine (PHENERGAN WITH CODEINE) 6.25-10 MG/5ML syrup; Take 10 mLs by mouth nightly as needed for Cough for 7 days. May cause drowsiness.  - Comprehensive metabolic panel  - Lipid panel    Hypothyroid  - TSH without Reflex    Acute bronchitis  - promethazine-codeine (PHENERGAN WITH CODEINE) 6.25-10 MG/5ML syrup; Take 10 mLs by mouth nightly as needed for Cough for 7 days. May cause drowsiness.  - azithromycin (ZITHROMAX Z-PAK) 250 MG tablet; Take 2 tablets on day 1, then 1 tablet daily for the next 4 days.  - CBC Auto Differential    Encounter for long-term (current) use of other medications  - Comprehensive metabolic panel  - Lipid panel  - TSH without Reflex          _________________________________________________  Plan:     Meds as above for current illness and labs for health maint issues    Return in about 6 months (around 12/01/2013), or if symptoms worsen or fail to improve, for NV for pneumovax in 2 weeks, cholesterol and mood and vit d.

## 2013-06-03 LAB — VITAMIN D 25 HYDROXY: Vit D, 25-Hydroxy: 63 ng/mL (ref 30–80)

## 2013-06-08 MED ORDER — LEVOTHYROXINE SODIUM 75 MCG PO TABS
75 MCG | ORAL_TABLET | Freq: Every day | ORAL | Status: DC
Start: 2013-06-08 — End: 2013-06-08

## 2013-06-08 MED ORDER — LEVOTHYROXINE SODIUM 75 MCG PO TABS
75 MCG | ORAL_TABLET | ORAL | Status: DC
Start: 2013-06-08 — End: 2014-03-05

## 2013-06-09 MED ORDER — LEVOTHYROXINE SODIUM 75 MCG PO TABS
75 MCG | ORAL_TABLET | Freq: Every day | ORAL | Status: DC
Start: 2013-06-09 — End: 2013-12-02

## 2013-07-09 MED ORDER — NASONEX 50 MCG/ACT NA SUSP
50 MCG/ACT | NASAL | Status: DC
Start: 2013-07-09 — End: 2014-08-03

## 2013-07-09 MED ORDER — SPIRIVA HANDIHALER 18 MCG IN CAPS
18 MCG | ORAL_CAPSULE | RESPIRATORY_TRACT | Status: DC
Start: 2013-07-09 — End: 2014-08-03

## 2013-09-07 MED ORDER — RISPERIDONE 3 MG PO TABS
3 MG | ORAL_TABLET | ORAL | Status: DC
Start: 2013-09-07 — End: 2014-03-07

## 2013-09-07 MED ORDER — ESCITALOPRAM OXALATE 20 MG PO TABS
20 MG | ORAL_TABLET | ORAL | Status: DC
Start: 2013-09-07 — End: 2013-09-07

## 2013-09-07 MED ORDER — RALOXIFENE HCL 60 MG PO TABS
60 MG | ORAL_TABLET | ORAL | Status: DC
Start: 2013-09-07 — End: 2013-09-07

## 2013-09-09 MED ORDER — RALOXIFENE HCL 60 MG PO TABS
60 MG | ORAL_TABLET | ORAL | Status: DC
Start: 2013-09-09 — End: 2014-03-11

## 2013-09-09 MED ORDER — ESCITALOPRAM OXALATE 20 MG PO TABS
20 MG | ORAL_TABLET | ORAL | Status: DC
Start: 2013-09-09 — End: 2014-03-11

## 2013-09-10 MED ORDER — VITAMIN D (ERGOCALCIFEROL) 1.25 MG (50000 UT) PO CAPS
1.25 MG (50000 UT) | ORAL_CAPSULE | ORAL | Status: DC
Start: 2013-09-10 — End: 2013-09-10

## 2013-09-11 MED ORDER — VITAMIN D (ERGOCALCIFEROL) 1.25 MG (50000 UT) PO CAPS
1.25 MG (50000 UT) | ORAL_CAPSULE | ORAL | Status: DC
Start: 2013-09-11 — End: 2013-12-02

## 2013-10-19 NOTE — Telephone Encounter (Signed)
See comments.  Gina

## 2013-12-02 LAB — CBC
Hematocrit: 40.8 % (ref 36.0–48.0)
Hemoglobin: 13 g/dL (ref 12.0–16.0)
MCH: 29.3 pg (ref 26.0–34.0)
MCHC: 31.9 g/dL (ref 31.0–36.0)
MCV: 91.8 fL (ref 80.0–100.0)
MPV: 11.2 fL — ABNORMAL HIGH (ref 5.0–10.5)
Platelets: 289 10*3/uL (ref 135–450)
RBC: 4.44 M/uL (ref 4.00–5.20)
RDW: 14.1 % (ref 12.4–15.4)
WBC: 8.7 10*3/uL (ref 4.0–11.0)

## 2013-12-02 MED ORDER — BUSPIRONE HCL 10 MG PO TABS
10 MG | ORAL_TABLET | Freq: Four times a day (QID) | ORAL | Status: AC
Start: 2013-12-02 — End: 2014-03-02

## 2013-12-02 NOTE — Progress Notes (Signed)
Chief Complaint   Patient presents with   ??? COPD     Pt states her breathing has been good, keeping well.  FBW    ??? Anxiety     on meds   ??? Hypothyroidism     on med for labs today   ??? Hyperlipidemia     on med for labs   ??? Other     vitamin d deficiency       Review of Systems   Constitutional: Negative for fever and malaise/fatigue.   HENT: Negative for congestion.    Eyes: Negative for blurred vision.   Respiratory: Negative for cough.    Cardiovascular: Negative for chest pain and leg swelling.   Gastrointestinal: Negative for abdominal pain.   Musculoskeletal: Negative for myalgias.   Skin: Negative for rash.   Neurological: Negative for headaches.   Psychiatric/Behavioral: Negative for depression. The patient is not nervous/anxious.          Outpatient Prescriptions Marked as Taking for the 12/02/13 encounter (Office Visit) with Merita Norton, MD   Medication Sig Dispense Refill   ??? busPIRone (BUSPAR) 10 MG tablet Take 1 tablet by mouth 4 times daily for 90 days.  360 tablet  1   ??? escitalopram (LEXAPRO) 20 MG tablet TAKE 1 TABLET BY MOUTH DAILY  90 tablet  1   ??? raloxifene (EVISTA) 60 MG tablet TAKE 1 TABLET BY MOUTH DAILY  90 tablet  1   ??? risperiDONE (RISPERDAL) 3 MG tablet TAKE 1 TABLET BY MOUTH DAILY  30 tablet  5   ??? NASONEX 50 MCG/ACT nasal spray INHALE 2 SPRAYS BY NASAL ROUTE DAILY  1 each  12   ??? SPIRIVA HANDIHALER 18 MCG inhalation capsule INHALE 1 CAPSULE INTO THE LUNGS VIA HANDIHALER EVERY DAY AS DIRECTED  30 capsule  12   ??? levothyroxine (SYNTHROID) 75 MCG tablet TAKE 1 TABLET BY MOUTH EVERY DAY  90 tablet  5   ??? simvastatin (ZOCOR) 40 MG tablet Take 1 tablet by mouth nightly.  30 tablet  12   ??? Nebulizers (AIRIAL COMPACT MINI NEBULIZER) MISC 1 each by Does not apply route 4 times daily.  1 each  0   ??? albuterol (PROVENTIL) (2.5 MG/3ML) 0.083% nebulizer solution Take 3 mLs by nebulization every 4 hours as needed for Wheezing.  25 vial  2   ??? traZODone (DESYREL) 150 MG tablet            Nursing note  reviewed  BP 136/88   Pulse 86   Wt 126 lb 9.6 oz (57.425 kg)   SpO2 97%   Filed Vitals:    12/02/13 0940   BP: 136/88   Pulse: 86   Weight: 126 lb 9.6 oz (57.425 kg)   SpO2: 97%     BP Readings from Last 3 Encounters:   12/02/13 136/88   06/01/13 96/60   12/11/12 100/68     Wt Readings from Last 3 Encounters:   12/02/13 126 lb 9.6 oz (57.425 kg)   06/01/13 119 lb (53.978 kg)   12/11/12 116 lb (52.617 kg)     Body mass index is 19.25 kg/(m^2).  No results found for this visit on 12/02/13.        Physical Exam   Constitutional: Kelli Cole is oriented to person, place, and time. Kelli Cole appears well-developed and well-nourished. No distress.   HENT:   Head: Normocephalic.   Mouth/Throat: Oropharynx is clear and moist.   Eyes: Conjunctivae are normal. Pupils  are equal, round, and reactive to light. Right eye exhibits no discharge. Left eye exhibits no discharge.   Neck: Normal range of motion.   Cardiovascular: Normal rate, regular rhythm and normal heart sounds.    No murmur heard.  Pulmonary/Chest: Effort normal and breath sounds normal. No respiratory distress. Kelli Cole has no wheezes. Kelli Cole has no rales.   Neurological: Kelli Cole is alert and oriented to person, place, and time.   Skin: Skin is warm and dry. Kelli Cole is not diaphoretic.   Psychiatric: Kelli Cole has a normal mood and affect. Her behavior is normal.   Nursing note and vitals reviewed.        _________________________________________________  Assessment:     Kelli Cole was seen today for copd, anxiety, hypothyroidism, hyperlipidemia and other.    Diagnoses and associated orders for this visit:    COPD (chronic obstructive pulmonary disease)  - CBC    Depression with anxiety  - busPIRone (BUSPAR) 10 MG tablet; Take 1 tablet by mouth 4 times daily for 90 days.    Vitamin D deficiency  - Vitamin D 25 Hydroxy    Hyperlipidemia  - Comprehensive Metabolic Panel  - Lipid Panel    Postinfectious hypothyroidism  - T4, free  - TSH without Reflex    Encounter for long-term (current) use of other  medications  - CBC  - Comprehensive Metabolic Panel  - Lipid Panel  - T4, free  - TSH without Reflex          _________________________________________________  Plan:     Labs and meds....aortic stenosis above.    At recheck remind mammo due.  Consider wean lexapro in spring; son concerned Kelli Cole is not very active and does not always take care of herself well.  We discussed this, Kelli Cole also had to move with him as he and his wife are getting a divorce.     Return in about 3 months (around 03/02/2014) for recheck mod and thyroid etc.

## 2013-12-03 LAB — COMPREHENSIVE METABOLIC PANEL
ALT: 9 U/L — ABNORMAL LOW (ref 10–40)
AST: 17 U/L (ref 15–37)
Albumin/Globulin Ratio: 1.8 (ref 1.1–2.2)
Albumin: 4.4 g/dL (ref 3.4–5.0)
Alkaline Phosphatase: 47 U/L (ref 40–129)
Anion Gap: 18 — ABNORMAL HIGH (ref 3–16)
BUN: 18 mg/dL (ref 7–20)
CO2: 21 mmol/L (ref 21–32)
Calcium: 9.2 mg/dL (ref 8.3–10.6)
Chloride: 106 mmol/L (ref 99–110)
Creatinine: 0.8 mg/dL (ref 0.6–1.2)
GFR African American: >60 (ref >60)
GFR Non-African American: >60 (ref >60)
Globulin: 2.5 g/dL
Glucose: 87 mg/dL (ref 70–99)
Potassium: 4.4 mmol/L (ref 3.5–5.1)
Sodium: 145 mmol/L (ref 136–145)
Total Bilirubin: 0.2 mg/dL (ref 0.0–1.0)
Total Protein: 6.9 g/dL (ref 6.4–8.2)

## 2013-12-03 LAB — LIPID PANEL
Cholesterol, Total: 167 mg/dL (ref 0–199)
HDL: 80 mg/dL — ABNORMAL HIGH (ref 40–60)
LDL Calculated: 75 mg/dL (ref <100)
Triglycerides: 60 mg/dL (ref 0–150)
VLDL Cholesterol Calculated: 12 mg/dL

## 2013-12-03 LAB — T4, FREE: T4 Free: 1.7 ng/ml (ref 0.9–1.8)

## 2013-12-03 LAB — TSH: TSH: 0.02 u[IU]/mL — ABNORMAL LOW (ref 0.27–4.20)

## 2013-12-05 LAB — VITAMIN D 25 HYDROXY: Vit D, 25-Hydroxy: 29 ng/mL — ABNORMAL LOW (ref 30–80)

## 2013-12-07 MED ORDER — VITAMIN D (ERGOCALCIFEROL) 1.25 MG (50000 UT) PO CAPS
1.25 MG (50000 UT) | ORAL_CAPSULE | ORAL | Status: DC
Start: 2013-12-07 — End: 2014-09-01

## 2014-01-26 MED ORDER — NABUMETONE 500 MG PO TABS
500 MG | ORAL_TABLET | Freq: Two times a day (BID) | ORAL | Status: DC
Start: 2014-01-26 — End: 2014-03-02

## 2014-01-26 NOTE — Telephone Encounter (Signed)
LMOM that script had been sent in

## 2014-01-26 NOTE — Telephone Encounter (Signed)
I actually have NO RECORD on her chart or in hospital records of her having gout, so I will give a short term nsaid for now and have her f-u for these symptoms

## 2014-02-02 MED ORDER — SIMVASTATIN 40 MG PO TABS
40 MG | ORAL_TABLET | ORAL | Status: DC
Start: 2014-02-02 — End: 2014-02-02

## 2014-02-03 MED ORDER — SIMVASTATIN 40 MG PO TABS
40 MG | ORAL_TABLET | ORAL | Status: DC
Start: 2014-02-03 — End: 2015-05-27

## 2014-02-09 NOTE — Telephone Encounter (Signed)
medication

## 2014-03-02 NOTE — Progress Notes (Signed)
Chief Complaint   Patient presents with   ??? Depression     on meds doing well   ??? Hypothyroidism     on med for recheck labs today   ??? Hyperlipidemia     on med, no labs needed today   ??? COPD     no problems on med       Review of Systems   Constitutional: Negative for fever and malaise/fatigue.   HENT: Negative for congestion.    Eyes: Negative for blurred vision.   Respiratory: Negative for cough.    Cardiovascular: Negative for chest pain and leg swelling.   Gastrointestinal: Negative for abdominal pain.   Musculoskeletal: Negative for myalgias.   Skin: Negative for rash.   Neurological: Negative for headaches.   Psychiatric/Behavioral: Negative for depression. The patient is not nervous/anxious.          Outpatient Prescriptions Marked as Taking for the 03/02/14 encounter (Office Visit) with Merita Norton, MD   Medication Sig Dispense Refill   ??? simvastatin (ZOCOR) 40 MG tablet TAKE 1 TABLET BY MOUTH EVERY NIGHT AT BEDTIME  90 tablet  5   ??? vitamin D (ERGOCALCIFEROL) 50000 UNITS CAPS capsule TAKE 1 CAPSULE BY MOUTH ONCE A WEEK  12 capsule  5   ??? busPIRone (BUSPAR) 10 MG tablet Take 1 tablet by mouth 4 times daily for 90 days.  360 tablet  1   ??? escitalopram (LEXAPRO) 20 MG tablet TAKE 1 TABLET BY MOUTH DAILY  90 tablet  1   ??? raloxifene (EVISTA) 60 MG tablet TAKE 1 TABLET BY MOUTH DAILY  90 tablet  1   ??? risperiDONE (RISPERDAL) 3 MG tablet TAKE 1 TABLET BY MOUTH DAILY  30 tablet  5   ??? NASONEX 50 MCG/ACT nasal spray INHALE 2 SPRAYS BY NASAL ROUTE DAILY  1 each  12   ??? SPIRIVA HANDIHALER 18 MCG inhalation capsule INHALE 1 CAPSULE INTO THE LUNGS VIA HANDIHALER EVERY DAY AS DIRECTED  30 capsule  12   ??? levothyroxine (SYNTHROID) 75 MCG tablet TAKE 1 TABLET BY MOUTH EVERY DAY  90 tablet  5   ??? Nebulizers (AIRIAL COMPACT MINI NEBULIZER) MISC 1 each by Does not apply route 4 times daily.  1 each  0   ??? albuterol (PROVENTIL) (2.5 MG/3ML) 0.083% nebulizer solution Take 3 mLs by nebulization every 4 hours as needed for  Wheezing.  25 vial  2   ??? traZODone (DESYREL) 150 MG tablet            Nursing note reviewed  BP 108/70    Pulse 98    Wt 115 lb (52.164 kg)    SpO2 98%    Filed Vitals:    03/02/14 1118   BP: 108/70   Pulse: 98   Weight: 115 lb (52.164 kg)   SpO2: 98%     BP Readings from Last 3 Encounters:   03/02/14 108/70   12/02/13 136/88   06/01/13 96/60     Wt Readings from Last 3 Encounters:   03/02/14 115 lb (52.164 kg)   12/02/13 126 lb 9.6 oz (57.425 kg)   06/01/13 119 lb (53.978 kg)     Body mass index is 17.49 kg/(m^2).  No results found for this visit on 03/02/14.        Physical Exam   Constitutional: She is oriented to person, place, and time. She appears well-developed and well-nourished. No distress.   HENT:   Head: Normocephalic.   Mouth/Throat:  Oropharynx is clear and moist.   Eyes: Conjunctivae are normal. Pupils are equal, round, and reactive to light. Right eye exhibits no discharge. Left eye exhibits no discharge.   Neck: Normal range of motion.   Cardiovascular: Normal rate, regular rhythm and normal heart sounds.    No murmur heard.  Pulmonary/Chest: Effort normal and breath sounds normal. No respiratory distress. She has no wheezes. She has no rales.   Neurological: She is alert and oriented to person, place, and time.   Skin: Skin is warm and dry. She is not diaphoretic.   Psychiatric: She has a normal mood and affect. Her behavior is normal.   Nursing note and vitals reviewed.        _________________________________________________  Assessment:     Kelli Cole was seen today for depression, hypothyroidism, hyperlipidemia and copd.    Diagnoses and associated orders for this visit:    Unspecified hypothyroidism  - TSH without Reflex    COPD (chronic obstructive pulmonary disease)    Encounter for long-term (current) use of other medications  - TSH without Reflex  - Vitamin D 25 Hydroxy    Vitamin D deficiency  - Vitamin D 25 Hydroxy    Other screening mammogram  - MAM Digital Screen Right    Personal history  of breast cancer  - MAM Digital Screen Right          _________________________________________________  Plan:     Labs and mammo ordered as above.  No changes to meds for now.  I am not sure I would alter her psych meds.    Return in about 6 months (around 09/01/2014) for pv htn with fbw, recheck mood and other.

## 2014-03-03 LAB — VITAMIN D 25 HYDROXY: Vit D, 25-Hydroxy: 41.8 ng/mL (ref >=30)

## 2014-03-03 LAB — TSH: TSH: 0.1 u[IU]/mL — ABNORMAL LOW (ref 0.27–4.20)

## 2014-03-05 ENCOUNTER — Encounter

## 2014-03-05 MED ORDER — LEVOTHYROXINE SODIUM 50 MCG PO TABS
50 MCG | ORAL_TABLET | Freq: Every day | ORAL | Status: DC
Start: 2014-03-05 — End: 2015-02-27

## 2014-03-10 MED ORDER — RALOXIFENE HCL 60 MG PO TABS
60 MG | ORAL_TABLET | ORAL | Status: DC
Start: 2014-03-10 — End: 2014-03-10

## 2014-03-10 MED ORDER — ESCITALOPRAM OXALATE 20 MG PO TABS
20 MG | ORAL_TABLET | ORAL | Status: DC
Start: 2014-03-10 — End: 2014-03-10

## 2014-03-10 MED ORDER — RISPERIDONE 3 MG PO TABS
3 MG | ORAL_TABLET | ORAL | Status: DC
Start: 2014-03-10 — End: 2014-10-01

## 2014-03-11 MED ORDER — RALOXIFENE HCL 60 MG PO TABS
60 MG | ORAL_TABLET | ORAL | Status: DC
Start: 2014-03-11 — End: 2015-03-28

## 2014-03-11 MED ORDER — ESCITALOPRAM OXALATE 20 MG PO TABS
20 MG | ORAL_TABLET | ORAL | Status: DC
Start: 2014-03-11 — End: 2015-03-28

## 2014-08-03 MED ORDER — SPIRIVA HANDIHALER 18 MCG IN CAPS
18 MCG | ORAL_CAPSULE | RESPIRATORY_TRACT | Status: DC
Start: 2014-08-03 — End: 2015-02-25

## 2014-08-03 MED ORDER — NASONEX 50 MCG/ACT NA SUSP
50 MCG/ACT | NASAL | Status: AC
Start: 2014-08-03 — End: ?

## 2014-08-03 NOTE — Telephone Encounter (Signed)
Miranda called from the pharmacy, Nasonex is on back order and won't be available until the end of September. Do you want to change it to Flonase or Nasocort? Please advise.

## 2014-08-04 MED ORDER — SIMVASTATIN 40 MG PO TABS
40 MG | ORAL_TABLET | ORAL | Status: DC
Start: 2014-08-04 — End: 2014-09-01

## 2014-09-01 LAB — CBC
Hematocrit: 40.7 % (ref 36.0–48.0)
Hemoglobin: 13.1 g/dL (ref 12.0–16.0)
MCH: 30.1 pg (ref 26.0–34.0)
MCHC: 32.3 g/dL (ref 31.0–36.0)
MCV: 93.3 fL (ref 80.0–100.0)
MPV: 10.4 fL (ref 5.0–10.5)
Platelets: 252 10*3/uL (ref 135–450)
RBC: 4.36 M/uL (ref 4.00–5.20)
RDW: 14 % (ref 12.4–15.4)
WBC: 7.4 10*3/uL (ref 4.0–11.0)

## 2014-09-01 NOTE — Progress Notes (Signed)
SODIUM (mmol/L)   Date Value   12/02/2013 145    BUN (mg/dL)   Date Value   86/57/846912/31/2014 18    GLUCOSE (mg/dL)   Date Value   62/95/284112/31/2014 87      POTASSIUM (mmol/L)   Date Value   12/02/2013 4.4    CREATININE (mg/dL)   Date Value   32/44/010212/31/2014 0.8           BP Readings from Last 2 Encounters:   03/02/14 108/70   12/02/13 136/88       Is patient currently taking any antihypertensive medications?     Yes   If yes, see med list as above    Is the patient reporting any side effects of antihypertensive medications?   No    Is the patient taking any over the counter medications?    Yes   If yes, see med list as above    Is the patient taking a daily aspirin?    Yes

## 2014-09-01 NOTE — Patient Instructions (Signed)
Self- Management Goals for the Patient with Hypertension:    It is important to have goals to work towards when you have Hypertension.    Below is a list of goals your doctor would like you to work towards to help control your hypertension and also maintain and improve your overall health.    Please select one of these goals to try before your next follow up visit:    Goal: I will take all medications as prescribed by my doctor, and I will call the office if I am having any medication problems.    Guess your barriers before they happen. Everyone runs into barriers to their goals. You may already know what's going to get in your way. Write down these problems (cost? time? stress? fear?), and think of ways to get around them.     Barriers to success: none  Plan for overcoming my barriers: N/A     Confidence: 9/10  Date goal set: 09/01/14  Date goal attained:          Learning About COPD and How to Prevent Lung Infections  How do lung infections affect COPD?     Lung infections like pneumonia and acute bronchitis are common causes of COPD flare-ups. And people who have COPD are more likely to get these lung infections, especially if they smoke.  When you have COPD, it is important to know the symptoms of pneumonia and acute bronchitis and call your doctor if you have them. Symptoms include:  ?? A cough that brings up more mucus than usual.  ?? Fever.  ?? Shortness of breath.  What can you do to prevent these infections?  Stay healthy   ?? Get a flu shot every year.  ?? Get a pneumococcal vaccine shot. If you have had one before, ask your doctor whether you need another dose. Two different types of pneumococcal vaccines are recommended for people ages 4665 and older.  ?? If you must be around people with colds or the flu, wash your hands often.  ?? Do not smoke. This is the most important step you can take to prevent more damage to your lungs. If you need help quitting, talk to your doctor about stop-smoking programs and  medicines. These can increase your chances of quitting for good.  ?? Avoid secondhand smoke, air pollution, and high altitudes. Also avoid cold, dry air and hot, humid air. Stay at home with your windows closed when air pollution is bad.  Exercise and eat well   ?? If your doctor recommends it, get more exercise. Walking is a good choice. Bit by bit, increase the amount you walk every day. Try for at least 30 minutes on most days of the week.  ?? Eat regular, well-balanced meals. Eating right keeps your energy levels up and helps your body fight infection.  ?? Get plenty of rest and sleep.  Follow-up care is a key part of your treatment and safety. Be sure to make and go to all appointments, and call your doctor if you are having problems. It's also a good idea to know your test results and keep a list of the medicines you take.   Where can you learn more?   Go to [email or web address] and sign in to your MyChart account. Enter [phone] in the Search Health Information box to learn more about ???Learning About COPD and How to Prevent Lung Infections.???    If you do not have an account, please click on the ???Sign Up  Now??? link.     ?? 2006-2015 [employer]. Care instructions adapted under license by [facility]. This care instruction is for use with your licensed healthcare professional. If you have questions about a medical condition or this instruction, always ask your healthcare professional. [place] any warranty or liability for your use of this information.  Content Version: 10.6.465758; Current as of: August 11, 2013

## 2014-09-01 NOTE — Progress Notes (Signed)
Chief Complaint   Patient presents with   ??? Hypertension     Pt here for BP ch and FBW   ??? Other     vitamin d level needs rechecked   ??? COPD     previous heavy smoker, mild chronic symptoms on spiriva   ??? Anxiety     nervousness is worse   ??? Hypothyroidism     dose lowered recently needs rechecked         Doing well except increased nerves.      Patient is established unless otherwise noted.    Review of Systems   Constitutional: Negative for fever and malaise/fatigue.   HENT: Negative for congestion.    Eyes: Negative for blurred vision.   Respiratory: Negative for cough.    Cardiovascular: Negative for chest pain and leg swelling.   Gastrointestinal: Negative for abdominal pain.   Musculoskeletal: Negative for myalgias.   Skin: Negative for rash.   Neurological: Negative for headaches.   Psychiatric/Behavioral: Negative for depression. The patient is not nervous/anxious.        Allergies   Allergen Reactions   ??? Penicillins Rash     Allergy history updated.    Outpatient Prescriptions Marked as Taking for the 09/01/14 encounter (Office Visit) with Merita NortonPenny Jayonna Meyering, MD   Medication Sig Dispense Refill   ??? aspirin 81 MG tablet Take 81 mg by mouth daily     ??? NASONEX 50 MCG/ACT nasal spray INHALE 2 SPRAYS IN EACH NOSTRIL EVERY DAY 1 Inhaler 5   ??? SPIRIVA HANDIHALER 18 MCG inhalation capsule INHALE 1 CAPSULE INTO TEH LUNGS VIA HANDIHALER EVERY DAY AS DIRECTED 30 capsule 5   ??? raloxifene (EVISTA) 60 MG tablet TAKE ONE TABLET BY MOUTH DAILY 90 tablet 3   ??? escitalopram (LEXAPRO) 20 MG tablet TAKE ONE TABLET BY MOUTH DAILY 90 tablet 3   ??? risperiDONE (RISPERDAL) 3 MG tablet TAKE 1 TABLET BY MOUTH DAILY 30 tablet 5   ??? levothyroxine (SYNTHROID) 50 MCG tablet Take 1 tablet by mouth daily. 90 tablet 3   ??? simvastatin (ZOCOR) 40 MG tablet TAKE 1 TABLET BY MOUTH EVERY NIGHT AT BEDTIME 90 tablet 5   ??? traZODone (DESYREL) 150 MG tablet          Tobacco use history updated.  Nonsmoker.      Nursing note reviewed.    Filed Vitals:     09/01/14 0913   BP: 110/76   Pulse: 92   Weight: 122 lb 12.8 oz (55.702 kg)   SpO2: 97%       BP Readings from Last 3 Encounters:   09/01/14 110/76   03/02/14 108/70   12/02/13 136/88     Wt Readings from Last 3 Encounters:   09/01/14 122 lb 12.8 oz (55.702 kg)   03/02/14 115 lb (52.164 kg)   12/02/13 126 lb 9.6 oz (57.425 kg)     Body mass index is 18.68 kg/(m^2).    No results found for this visit on 09/01/14.        Physical Exam   Constitutional: She is oriented to person, place, and time. She appears well-developed and well-nourished. No distress.   HENT:   Head: Normocephalic.   Mouth/Throat: Oropharynx is clear and moist.   Eyes: Conjunctivae are normal. Pupils are equal, round, and reactive to light. Right eye exhibits no discharge. Left eye exhibits no discharge.   Neck: Normal range of motion.   Cardiovascular: Normal rate, regular rhythm and normal heart  sounds.    No murmur heard.  Pulmonary/Chest: Effort normal and breath sounds normal. No respiratory distress. She has no wheezes. She has no rales.   Neurological: She is alert and oriented to person, place, and time.   Skin: Skin is warm and dry. She is not diaphoretic.   Psychiatric: She has a normal mood and affect. Her behavior is normal.   Nursing note and vitals reviewed.        _________________________________________________  Assessment:     Docia was seen today for hypertension, other, copd, anxiety and hypothyroidism.    Diagnoses and associated orders for this visit:    Chronic obstructive pulmonary disease, unspecified COPD type (HCC)    Depression with anxiety    Hyperlipidemia  - Comprehensive Metabolic Panel  - Lipid Panel    Encounter for long-term (current) use of other medications  - CBC  - Lipid Panel  - TSH without Reflex  - Vitamin D 25 Hydroxy    Unspecified hypothyroidism  - TSH without Reflex    Personal history of breast cancer    Need for prophylactic vaccination and inoculation against influenza  - Flu Vaccine greater than or  = 64yo IM    Vitamin D deficiency  - Vitamin D 25 Hydroxy          _________________________________________________  Plan:     Has mammo order, patient advised to schedule with hx of breast cancer.  She declines today.      Flu vaccine today.  Suggest prevnar next fall, then following year suggest pneumovax.    Keasia received counseling on the following healthy behaviors: respiratory issues and need for mammo.    Patient given educational materials on preventing lung infections.      Azlin was offered a self tracking handout.  They chose none.  They were instructed any they selected to complete to her next appointment.     Discussed use, benefit, and side effects of prescribed medications.  Barriers to medication compliance addressed.  All patient questions answered.  Pt voiced understanding.       Return in about 6 months (around 03/02/2015) for recheck chronic medical problem--copd and lipids and thyroid.    Encounter note is signed electronically at date and time of note closure.

## 2014-09-02 LAB — COMPREHENSIVE METABOLIC PANEL
ALT: 9 U/L — ABNORMAL LOW (ref 10–40)
AST: 15 U/L (ref 15–37)
Albumin/Globulin Ratio: 1.8 (ref 1.1–2.2)
Albumin: 4.4 g/dL (ref 3.4–5.0)
Alkaline Phosphatase: 36 U/L — ABNORMAL LOW (ref 40–129)
Anion Gap: 13 (ref 3–16)
BUN: 20 mg/dL (ref 7–20)
CO2: 26 mmol/L (ref 21–32)
Calcium: 9.3 mg/dL (ref 8.3–10.6)
Chloride: 105 mmol/L (ref 99–110)
Creatinine: 0.9 mg/dL (ref 0.6–1.2)
GFR African American: >60 (ref >60)
GFR Non-African American: >60 (ref >60)
Globulin: 2.5 g/dL
Glucose: 90 mg/dL (ref 70–99)
Potassium: 4.2 mmol/L (ref 3.5–5.1)
Sodium: 144 mmol/L (ref 136–145)
Total Bilirubin: <0.2 mg/dL (ref 0.0–1.0)
Total Protein: 6.9 g/dL (ref 6.4–8.2)

## 2014-09-02 LAB — TSH: TSH: 1.41 u[IU]/mL (ref 0.27–4.20)

## 2014-09-02 LAB — LIPID PANEL
Cholesterol, Total: 170 mg/dL (ref 0–199)
HDL: 84 mg/dL — ABNORMAL HIGH (ref 40–60)
LDL Calculated: 76 mg/dL (ref <100)
Triglycerides: 52 mg/dL (ref 0–150)
VLDL Cholesterol Calculated: 10 mg/dL

## 2014-09-02 LAB — VITAMIN D 25 HYDROXY: Vit D, 25-Hydroxy: 40.8 ng/mL (ref >=30)

## 2014-10-01 MED ORDER — RISPERIDONE 3 MG PO TABS
3 MG | ORAL_TABLET | ORAL | Status: DC
Start: 2014-10-01 — End: 2014-10-01

## 2014-10-04 MED ORDER — RISPERIDONE 3 MG PO TABS
3 MG | ORAL_TABLET | ORAL | Status: DC
Start: 2014-10-04 — End: 2015-06-29

## 2014-10-05 MED ORDER — ESCITALOPRAM OXALATE 20 MG PO TABS
20 MG | ORAL_TABLET | ORAL | Status: DC
Start: 2014-10-05 — End: 2015-03-28

## 2014-11-03 MED ORDER — BUSPIRONE HCL 10 MG PO TABS
10 MG | ORAL_TABLET | ORAL | Status: DC
Start: 2014-11-03 — End: 2015-05-27

## 2015-02-25 MED ORDER — SPIRIVA HANDIHALER 18 MCG IN CAPS
18 MCG | ORAL_CAPSULE | RESPIRATORY_TRACT | Status: DC
Start: 2015-02-25 — End: 2015-03-28

## 2015-02-28 MED ORDER — SIMVASTATIN 40 MG PO TABS
40 MG | ORAL_TABLET | ORAL | Status: DC
Start: 2015-02-28 — End: 2015-02-28

## 2015-02-28 MED ORDER — LEVOTHYROXINE SODIUM 50 MCG PO TABS
50 MCG | ORAL_TABLET | ORAL | Status: AC
Start: 2015-02-28 — End: ?

## 2015-02-28 MED ORDER — VITAMIN D (ERGOCALCIFEROL) 1.25 MG (50000 UT) PO CAPS
1.25 MG (50000 UT) | ORAL_CAPSULE | ORAL | Status: DC
Start: 2015-02-28 — End: 2015-05-27

## 2015-03-01 MED ORDER — SIMVASTATIN 40 MG PO TABS
40 MG | ORAL_TABLET | ORAL | Status: DC
Start: 2015-03-01 — End: 2015-05-27

## 2015-03-28 MED ORDER — ESCITALOPRAM OXALATE 20 MG PO TABS
20 MG | ORAL_TABLET | ORAL | Status: DC
Start: 2015-03-28 — End: 2015-06-27

## 2015-03-28 MED ORDER — SPIRIVA HANDIHALER 18 MCG IN CAPS
18 MCG | ORAL_CAPSULE | RESPIRATORY_TRACT | Status: DC
Start: 2015-03-28 — End: 2015-06-25

## 2015-03-28 MED ORDER — RALOXIFENE HCL 60 MG PO TABS
60 MG | ORAL_TABLET | ORAL | Status: DC
Start: 2015-03-28 — End: 2015-06-27

## 2015-03-28 NOTE — Telephone Encounter (Signed)
Patient has been schedule Friday 05/17/2015 at 9 am

## 2015-05-27 ENCOUNTER — Ambulatory Visit
Admit: 2015-05-27 | Discharge: 2015-05-27 | Payer: BLUE CROSS/BLUE SHIELD | Attending: Family Medicine | Primary: Family Medicine

## 2015-05-27 DIAGNOSIS — J42 Unspecified chronic bronchitis: Secondary | ICD-10-CM

## 2015-05-27 LAB — TSH: TSH: 1.93 u[IU]/mL (ref 0.27–4.20)

## 2015-05-27 LAB — COMPREHENSIVE METABOLIC PANEL
ALT: 11 U/L (ref 10–40)
AST: 20 U/L (ref 15–37)
Albumin/Globulin Ratio: 1.5 (ref 1.1–2.2)
Albumin: 4.5 g/dL (ref 3.4–5.0)
Alkaline Phosphatase: 45 U/L (ref 40–129)
Anion Gap: 19 — ABNORMAL HIGH (ref 3–16)
BUN: 20 mg/dL (ref 7–20)
CO2: 24 mmol/L (ref 21–32)
Calcium: 10.1 mg/dL (ref 8.3–10.6)
Chloride: 100 mmol/L (ref 99–110)
Creatinine: 1.3 mg/dL — ABNORMAL HIGH (ref 0.6–1.2)
GFR African American: 50 — AB (ref >60)
GFR Non-African American: 41 — AB (ref >60)
Globulin: 3.1 g/dL
Glucose: 84 mg/dL (ref 70–99)
Potassium: 4.8 mmol/L (ref 3.5–5.1)
Sodium: 143 mmol/L (ref 136–145)
Total Bilirubin: 0.3 mg/dL (ref 0.0–1.0)
Total Protein: 7.6 g/dL (ref 6.4–8.2)

## 2015-05-27 LAB — CBC
Hematocrit: 44 % (ref 36.0–48.0)
Hemoglobin: 14.1 g/dL (ref 12.0–16.0)
MCH: 29.5 pg (ref 26.0–34.0)
MCHC: 32.1 g/dL (ref 31.0–36.0)
MCV: 91.9 fL (ref 80.0–100.0)
MPV: 10 fL (ref 5.0–10.5)
Platelets: 303 10*3/uL (ref 135–450)
RBC: 4.79 M/uL (ref 4.00–5.20)
RDW: 13.9 % (ref 12.4–15.4)
WBC: 11.9 10*3/uL — ABNORMAL HIGH (ref 4.0–11.0)

## 2015-05-27 LAB — LIPID PANEL
Cholesterol, Total: 187 mg/dL (ref 0–199)
HDL: 79 mg/dL — ABNORMAL HIGH (ref 40–60)
LDL Calculated: 90 mg/dL (ref <100)
Triglycerides: 92 mg/dL (ref 0–150)
VLDL Cholesterol Calculated: 18 mg/dL

## 2015-05-27 LAB — HEPATITIS C ANTIBODY: Hep C Ab Interp: NONREACTIVE

## 2015-05-27 LAB — T4, FREE: T4 Free: 1.4 ng/dL (ref 0.9–1.8)

## 2015-05-27 MED ORDER — TRAZODONE HCL 150 MG PO TABS
150 MG | ORAL_TABLET | Freq: Every evening | ORAL | 0 refills | Status: AC | PRN
Start: 2015-05-27 — End: ?

## 2015-05-27 MED ORDER — SIMVASTATIN 40 MG PO TABS
40 MG | ORAL_TABLET | Freq: Every evening | ORAL | 3 refills | Status: DC
Start: 2015-05-27 — End: 2016-05-16

## 2015-05-27 MED ORDER — VITAMIN D (ERGOCALCIFEROL) 1.25 MG (50000 UT) PO CAPS
1.25 MG (50000 UT) | ORAL_CAPSULE | ORAL | 3 refills | Status: AC
Start: 2015-05-27 — End: ?

## 2015-05-27 NOTE — Progress Notes (Signed)
Chief Complaint   Patient presents with   ??? Migraine     Patient is here to follow up on COPD and FBW   ??? COPD     doign well no longer smoking   ??? Depression     no meds now--not seeing a specialist       [place]      Patient is established unless otherwise noted.    Review of Systems   Constitutional: Negative for fever and malaise/fatigue.   HENT: Negative for congestion.    Eyes: Negative for blurred vision.   Respiratory: Positive for shortness of breath (minimal).    Cardiovascular: Negative for chest pain and leg swelling.   Gastrointestinal: Negative for abdominal pain.   Musculoskeletal: Negative for myalgias.   Skin: Negative for rash.   Neurological: Positive for headaches.   Psychiatric/Behavioral: Positive for depression. The patient is nervous/anxious.        Allergies   Allergen Reactions   ??? Penicillins Rash     Allergy history updated.    Outpatient Prescriptions Marked as Taking for the 05/27/15 encounter (Office Visit) with Merita Norton, MD   Medication Sig Dispense Refill   ??? traZODone (DESYREL) 150 MG tablet 1 tablet nightly as needed 1 tablet 0   ??? simvastatin (ZOCOR) 40 MG tablet Take 1 tablet by mouth nightly 90 tablet 3   ??? escitalopram (LEXAPRO) 20 MG tablet TAKE 1 TABLET BY MOUTH DAILY 90 tablet 0   ??? raloxifene (EVISTA) 60 MG tablet TAKE 1 TABLET BY MOUTH DAILY 90 tablet 0   ??? SPIRIVA HANDIHALER 18 MCG inhalation capsule INHALE CONTENTS OF 1 CAPSULE INTO THE LUNGS BY USING HANDIHALER EVERY DAY AS DIRECTED 30 capsule 2   ??? levothyroxine (SYNTHROID) 50 MCG tablet TAKE 1 TABLET BY MOUTH DAILY 90 tablet 0   ??? risperiDONE (RISPERDAL) 3 MG tablet TAKE 1 TABLET BY MOUTH DAILY 90 tablet 3   ??? aspirin 81 MG tablet Take 81 mg by mouth daily     ??? NASONEX 50 MCG/ACT nasal spray INHALE 2 SPRAYS IN EACH NOSTRIL EVERY DAY 1 Inhaler 5       Tobacco use history updated.  Former heavy smoker.      Nursing note reviewed.    Vitals:    05/27/15 0931   BP: 116/70   Site: Right Arm    Position: Sitting   Cuff Size: Small Adult   Pulse: 93   SpO2: 98%   Weight: 126 lb 9.6 oz (57.4 kg)   Height: 5\' 6"  (1.676 m)       BP Readings from Last 3 Encounters:   05/27/15 116/70   09/01/14 110/76   03/02/14 108/70     Wt Readings from Last 3 Encounters:   05/27/15 126 lb 9.6 oz (57.4 kg)   09/01/14 122 lb 12.8 oz (55.7 kg)   03/02/14 115 lb (52.2 kg)     Body mass index is 20.43 kg/(m^2).    No results found for this visit on 05/27/15.        Physical Exam   Constitutional: She is oriented to person, place, and time. She appears well-developed and well-nourished. No distress.   HENT:   Head: Normocephalic.   Mouth/Throat: Oropharynx is clear and moist.   Eyes: Conjunctivae are normal. Pupils are equal, round, and reactive to light. Right eye exhibits no discharge. Left eye exhibits no discharge.   Neck: Normal range of motion.   Cardiovascular: Normal  rate, regular rhythm and normal heart sounds.    No murmur heard.  Pulmonary/Chest: Effort normal and breath sounds normal. No respiratory distress. She has no wheezes. She has no rales.   Neurological: She is alert and oriented to person, place, and time.   Skin: Skin is warm and dry. She is not diaphoretic.   Psychiatric: She has a normal mood and affect. Her behavior is normal.   Nursing note and vitals reviewed.        _________________________________________________  Assessment:     Kelli Cole was seen today for migraine, copd and depression.    Diagnoses and all orders for this visit:    Chronic bronchitis, unspecified chronic bronchitis type (HCC)    Hyperlipidemia LDL goal <130  Orders:  -     Lipid Panel    Personal history of breast cancer    Depression with anxiety    Encounter for long-term (current) use of other medications  Orders:  -     CBC  -     Comprehensive Metabolic Panel  -     Lipid Panel  -     T4, Free  -     TSH without Reflex    Screening examination for infectious disease  Orders:  -     Hepatitis C Antibody  -     HIV  Screen    Screen for colon cancer  Orders:  -     POCT Fecal Immunochemical Test (FIT); Future    Acquired hypothyroidism  Orders:  -     T4, Free  -     TSH without Reflex    Other orders  -     traZODone (DESYREL) 150 MG tablet; 1 tablet nightly as needed  -     simvastatin (ZOCOR) 40 MG tablet; Take 1 tablet by mouth nightly        _________________________________________________  Plan:     Patient declines mammo, despite history of breast cancer.  Agrees to some HM as above.      Return in about 1 year (around 05/26/2016), or if symptoms worsen or fail to improve, for PLEASE SET REMINDER CALL.    Encounter note is signed electronically at date and time of note closure.

## 2015-05-30 LAB — HIV SCREEN: HIV-1/HIV-2 Ab: NONREACTIVE

## 2015-06-03 ENCOUNTER — Encounter: Admit: 2015-06-03 | Discharge: 2015-06-03 | Payer: BLUE CROSS/BLUE SHIELD | Primary: Family Medicine

## 2015-06-03 DIAGNOSIS — Z129 Encounter for screening for malignant neoplasm, site unspecified: Secondary | ICD-10-CM

## 2015-06-03 LAB — POCT FECAL IMMUNOCHEMICAL TEST (FIT): Occult Blood Fecal: NEGATIVE

## 2015-06-27 ENCOUNTER — Encounter

## 2015-06-27 MED ORDER — RALOXIFENE HCL 60 MG PO TABS
60 MG | ORAL_TABLET | ORAL | 3 refills | Status: AC
Start: 2015-06-27 — End: ?

## 2015-06-27 MED ORDER — SPIRIVA HANDIHALER 18 MCG IN CAPS
18 MCG | ORAL_CAPSULE | RESPIRATORY_TRACT | 5 refills | Status: AC
Start: 2015-06-27 — End: ?

## 2015-06-27 MED ORDER — ESCITALOPRAM OXALATE 20 MG PO TABS
20 MG | ORAL_TABLET | ORAL | 0 refills | Status: DC
Start: 2015-06-27 — End: 2015-09-23

## 2015-06-29 NOTE — Telephone Encounter (Signed)
According to [employer]. Patient Is picking up her Lexapro. She is not getting the Risperdol any longer.

## 2015-09-23 MED ORDER — ESCITALOPRAM OXALATE 20 MG PO TABS
20 MG | ORAL_TABLET | ORAL | 5 refills | Status: AC
Start: 2015-09-23 — End: ?

## 2015-12-19 MED ORDER — RISPERIDONE 3 MG PO TABS
3 MG | ORAL_TABLET | ORAL | 1 refills | Status: DC
Start: 2015-12-19 — End: 2016-06-14

## 2016-02-27 NOTE — Progress Notes (Signed)
Results for the following test have been finalized and entered into the patient chart.

## 2016-05-16 MED ORDER — SIMVASTATIN 40 MG PO TABS
40 MG | ORAL_TABLET | ORAL | 0 refills | Status: AC
Start: 2016-05-16 — End: ?

## 2016-06-14 MED ORDER — RISPERIDONE 3 MG PO TABS
3 MG | ORAL_TABLET | ORAL | 0 refills | Status: AC
Start: 2016-06-14 — End: ?

## 2018-12-03 DIAGNOSIS — M069 Rheumatoid arthritis, unspecified: Secondary | ICD-10-CM

## 2018-12-03 HISTORY — DX: Rheumatoid arthritis, unspecified: M06.9

## 2020-09-08 NOTE — Progress Notes (Signed)
Patient referred by Renette Butters, MD for pre-op evaluation  Subjective:   Morgan Johnson, female    DOB: September 20, 1951, 69 y.o.   MRN: [id number]   Chief Complaint  Patient presents with  . Abnormal ECG  . Pre-op Exam  . New Patient (Initial Visit)    HPI  69 y.o. [place] female , former smoker, referred for pre-op risk stratification.  Patient is hoping to undergo left hip replacement soon. While her physical activity is limited due to hip pain, she is able to climb a flight of 7+7 steps in her house without any chest pain, shortness of breath. She smoked for 4-5 years, quit in 2008.    Past Medical History:  Diagnosis Date  . Thyroid disease      Past Surgical History:  Procedure Laterality Date  . MASTECTOMY Left 11/06/1985  . TOTAL ABDOMINAL HYSTERECTOMY       Social History   Tobacco Use  Smoking Status Former Smoker  . Types: Cigarettes  . Quit date: 2008  . Years since quitting: 13.7  Smokeless Tobacco Never Used    Social History   Substance and Sexual Activity  Alcohol Use Not Currently  . Alcohol/week: 2.0 standard drinks  . Types: 1 Cans of beer, 1 Shots of liquor per week     History reviewed. No pertinent family history.   Current Outpatient Medications on File Prior to Visit  Medication Sig Dispense Refill  . cyclobenzaprine (FLEXERIL) 10 MG tablet Take 10 mg by mouth 3 (three) times daily as needed for muscle spasms.    . hydroxychloroquine (PLAQUENIL) 200 MG tablet Take 200 mg by mouth daily.    Marland Kitchen levothyroxine (SYNTHROID) 50 MCG tablet Take 50 mcg by mouth daily before breakfast.     No current facility-administered medications on file prior to visit.    Cardiovascular and other pertinent studies:  EKG 09/09/2020: Sinus rhythm 94 bpm  Left atrial enlargement Nonspecific T wave changes  EKG 02/18/2020:  Sinus Tachycardia 110 bpm   Recent labs: 06/16/2020: H/H 10.5/34.0. MCV 71.1. Platelets 441   Review of Systems    Cardiovascular: Negative for chest pain, dyspnea on exertion, leg swelling, palpitations and syncope.  Musculoskeletal: Positive for joint pain.         Vitals:   09/09/20 1047  BP: 138/70  Pulse: 98  Resp: 16  SpO2: 96%   Body mass index is 23.63 kg/m. Filed Weights   09/09/20 1047  Weight: 155 lb 6.4 oz (70.5 kg)     Objective:   Physical Exam Vitals and nursing note reviewed.  Constitutional:      General: She is not in acute distress. Neck:     Vascular: No JVD.  Cardiovascular:     Rate and Rhythm: Normal rate and regular rhythm.     Heart sounds: Normal heart sounds. No murmur heard.   Pulmonary:     Effort: Pulmonary effort is normal.     Breath sounds: Normal breath sounds. No wheezing or rales.           Assessment & Recommendations:   69 y.o. [place] female , former smoker, referred for pre-op risk stratification.  Nonspecific ST-T changes on EKG. No angina symptoms at baseline with fair functional capacity. Ischemia testing not indicated. Will obtain echocardiogram to rule put any major structural abnormality, after which, she may proceed with low perioperative cardiac risk.  I will see her on as needed basis.    Thank you for referring  the patient to [place]. Please feel free to contact with any questions.   Nigel Mormon, MD Pager: [phone] Office: [phone]

## 2020-09-09 ENCOUNTER — Encounter: Payer: Self-pay | Admitting: Cardiology

## 2020-09-09 ENCOUNTER — Ambulatory Visit: Payer: Self-pay | Admitting: Cardiology

## 2020-09-09 ENCOUNTER — Other Ambulatory Visit: Payer: Self-pay

## 2020-09-09 VITALS — BP 138/70 | HR 98 | Resp 16 | Ht 68.0 in | Wt 155.4 lb

## 2020-09-09 DIAGNOSIS — Z01818 Encounter for other preprocedural examination: Secondary | ICD-10-CM | POA: Insufficient documentation

## 2020-09-09 DIAGNOSIS — R9431 Abnormal electrocardiogram [ECG] [EKG]: Secondary | ICD-10-CM

## 2020-09-12 ENCOUNTER — Other Ambulatory Visit: Payer: Self-pay | Admitting: Cardiology

## 2020-09-12 DIAGNOSIS — Z01818 Encounter for other preprocedural examination: Secondary | ICD-10-CM

## 2020-09-13 ENCOUNTER — Ambulatory Visit: Payer: Federal, State, Local not specified - PPO

## 2020-09-13 ENCOUNTER — Other Ambulatory Visit: Payer: Self-pay

## 2020-09-13 DIAGNOSIS — Z01818 Encounter for other preprocedural examination: Secondary | ICD-10-CM

## 2020-09-14 ENCOUNTER — Other Ambulatory Visit: Payer: Self-pay | Admitting: Cardiology

## 2020-09-14 DIAGNOSIS — I351 Nonrheumatic aortic (valve) insufficiency: Secondary | ICD-10-CM

## 2020-09-14 NOTE — Progress Notes (Signed)
No contraindication for hip surgery. surgery. Moderate leakiness of aortic valve. Recommend repeat echocardiogram in 1 year, then f/u  Thanks MJP

## 2020-09-16 NOTE — Progress Notes (Signed)
Called and spoke with patient regarding her echocardiogram results.

## 2020-09-19 ENCOUNTER — Other Ambulatory Visit (HOSPITAL_COMMUNITY): Payer: Self-pay

## 2020-09-19 ENCOUNTER — Ambulatory Visit (INDEPENDENT_AMBULATORY_CARE_PROVIDER_SITE_OTHER): Payer: Federal, State, Local not specified - PPO

## 2020-09-19 ENCOUNTER — Encounter: Payer: Self-pay | Admitting: Pulmonary Disease

## 2020-09-19 ENCOUNTER — Ambulatory Visit (INDEPENDENT_AMBULATORY_CARE_PROVIDER_SITE_OTHER): Payer: Federal, State, Local not specified - PPO | Admitting: Pulmonary Disease

## 2020-09-19 ENCOUNTER — Other Ambulatory Visit: Payer: Self-pay

## 2020-09-19 VITALS — BP 126/78 | HR 115 | Temp 97.8°F | Ht 68.0 in | Wt 157.6 lb

## 2020-09-19 DIAGNOSIS — R1310 Dysphagia, unspecified: Secondary | ICD-10-CM | POA: Diagnosis not present

## 2020-09-19 DIAGNOSIS — J449 Chronic obstructive pulmonary disease, unspecified: Secondary | ICD-10-CM

## 2020-09-19 NOTE — Patient Instructions (Addendum)
-   We will check pulmonary function tests and schedule those as soon as possible - We will check a chest x-ray today - We will send you for a modified barium swallow to evaluate the trouble swallowing - You are low risk for post-operative complications for surgery from a respiratory standpoint

## 2020-09-19 NOTE — Progress Notes (Signed)
Synopsis: Referred by Dr. Edmonia Lynch, MD for Pre-op evaluation  Subjective:   PATIENT ID: Morgan Johnson GENDER: female DOB: [birth year], MRN: [id number]   HPI  Chief Complaint  Patient presents with  . Consult    surgical problem with choaking on food      Morgan Johnson is a 69 year old woman, former smoker with history of COPD, hypertension, hypothyroidism/goiter, rheumatoid arthritis who is referred to pulmonary clinic for pre-op respiratory evaluation for pending hip replacement.   She denies issues with shortness of breath, cough, chest tightness or wheezing. She is a former smoker and quit in 2008 and has a 28 pack year history. She was diagnosed with COPD by previous providers and does not report having breathing tests done. She was trailed on inhaler therapy in the past and denies any improvements in her breathing. She is able to walk up 2 flights of stairs without shortness of breath but is mostly limited by her hip pain in regards to physical activity.   She was diagnosed with rheumatoid arthritis last year and has been on hydroxychloroquine with improvement of her joint pains/swelling in her hands and feet.   She complains of food getting stuck in her throat with solid foods and no issues with liquids. She has most trouble with breads but has to make sure meats are finely chewed in order to swallow appropriately. She has noted these symptoms for the past 6 months.   Past Medical History:  Diagnosis Date  . COPD (chronic obstructive pulmonary disease) ([place])   . Hypertension   . Hypothyroidism    goiter on thyroid  . Rheumatoid arthritis ([place]) 2020  . Thyroid disease      History reviewed. No pertinent family history.   Social History   Socioeconomic History  . Marital status: Single    Spouse name: Not on file  . Number of children: 1  . Years of education: Not on file  . Highest education level: Not on file  Occupational History  . Not on file  Tobacco Use    . Smoking status: Former Smoker    Packs/day: 1.00    Years: 28.00    Pack years: 28.00    Types: Cigarettes    Quit date: 2008    Years since quitting: 13.8  . Smokeless tobacco: Never Used  Vaping Use  . Vaping Use: Never used  Substance and Sexual Activity  . Alcohol use: Not Currently    Alcohol/week: 2.0 standard drinks    Types: 1 Cans of beer, 1 Shots of liquor per week  . Drug use: Never  . Sexual activity: Not on file  Other Topics Concern  . Not on file  Social History Narrative  . Not on file   Social Determinants of Health   Financial Resource Strain:   . Difficulty of Paying Living Expenses: Not on file  Food Insecurity:   . Worried About Charity fundraiser in the Last Year: Not on file  . Ran Out of Food in the Last Year: Not on file  Transportation Needs:   . Lack of Transportation (Medical): Not on file  . Lack of Transportation (Non-Medical): Not on file  Physical Activity:   . Days of Exercise per Week: Not on file  . Minutes of Exercise per Session: Not on file  Stress:   . Feeling of Stress : Not on file  Social Connections:   . Frequency of Communication with Friends and Family: Not on file  .  Frequency of Social Gatherings with Friends and Family: Not on file  . Attends Religious Services: Not on file  . Active Member of Clubs or Organizations: Not on file  . Attends Archivist Meetings: Not on file  . Marital Status: Not on file  Intimate Partner Violence:   . Fear of Current or Ex-Partner: Not on file  . Emotionally Abused: Not on file  . Physically Abused: Not on file  . Sexually Abused: Not on file     Allergies  Allergen Reactions  . Penicillins Rash     Outpatient Medications Prior to Visit  Medication Sig Dispense Refill  . cyclobenzaprine (FLEXERIL) 10 MG tablet Take 10 mg by mouth 3 (three) times daily as needed for muscle spasms.    . hydroxychloroquine (PLAQUENIL) 200 MG tablet Take 200 mg by mouth daily.    Marland Kitchen  levothyroxine (SYNTHROID) 50 MCG tablet Take 50 mcg by mouth daily before breakfast.     No facility-administered medications prior to visit.    Review of Systems  Constitutional: Negative for chills, diaphoresis, fever, malaise/fatigue and weight loss.  HENT: Negative for congestion, nosebleeds, sinus pain and sore throat.   Eyes: Negative for blurred vision.  Respiratory: Negative for cough, hemoptysis, sputum production, shortness of breath and wheezing.   Cardiovascular: Negative for chest pain, palpitations, orthopnea, claudication, leg swelling and PND.  Gastrointestinal: Negative for abdominal pain, diarrhea, nausea and vomiting.  Genitourinary: Negative for hematuria.  Musculoskeletal: Positive for joint pain.  Neurological: Negative for dizziness, weakness and headaches.  Endo/Heme/Allergies: Does not bruise/bleed easily.  Psychiatric/Behavioral: Negative.     Objective:   Vitals:   09/19/20 0932  BP: 126/78  Pulse: (!) 115  Temp: 97.8 F (36.6 C)  TempSrc: Temporal  SpO2: 97%  Weight: 157 lb 9.6 oz (71.5 kg)  Height: 5\' 8"  (1.727 m)     Physical Exam Constitutional:      General: She is not in acute distress.    Appearance: Normal appearance. She is normal weight. She is not ill-appearing.  HENT:     Head: Normocephalic and atraumatic.     Nose: Nose normal.     Mouth/Throat:     Mouth: Mucous membranes are moist.     Pharynx: Oropharynx is clear.  Eyes:     General: No scleral icterus.    Extraocular Movements: Extraocular movements intact.     Conjunctiva/sclera: Conjunctivae normal.     Pupils: Pupils are equal, round, and reactive to light.  Cardiovascular:     Rate and Rhythm: Normal rate.     Heart sounds: No murmur heard.   Pulmonary:     Effort: Pulmonary effort is normal. No respiratory distress.     Breath sounds: Normal breath sounds. No wheezing.  Abdominal:     General: Abdomen is flat. Bowel sounds are normal. There is no distension.      Palpations: Abdomen is soft.     Tenderness: There is no abdominal tenderness.  Musculoskeletal:     Cervical back: Neck supple.     Right lower leg: No edema.     Left lower leg: No edema.  Lymphadenopathy:     Cervical: No cervical adenopathy.  Skin:    Findings: No lesion or rash.  Neurological:     General: No focal deficit present.     Mental Status: She is alert and oriented to person, place, and time. Mental status is at baseline.  Psychiatric:  Mood and Affect: Mood normal.        Behavior: Behavior normal.        Thought Content: Thought content normal.        Judgment: Judgment normal.     CBC No results found for: WBC, RBC, HGB, HCT, PLT, MCV, MCH, MCHC, RDW, LYMPHSABS, MONOABS, EOSABS, BASOSABS   Chest imaging: CXR 09/19/20 Cardiac shadow is within normal limits. The lungs are well aerated bilaterally. No focal infiltrate or sizable effusion is seen. Chronic appearing thoracic wedging is noted. No acute bony abnormality is seen.  PFT: None on file  Labs: 06/16/20 CBC: WBC 11.83, HGB 10.5, HCT 34,  CMP: Na 142, K 3.4, Cl 105, Bicarb 23, BUN 14, Cr 1.3, AST 25, ALT 19, T.Bili 0.5, Albumin 3.5 TSH 4.31   Echo: 09/13/20 Left ventricle cavity is normal in size and wall thickness. Normal global  wall motion. Normal LV systolic function with visual EF 55-60%. Doppler  evidence of grade I (impaired) diastolic dysfunction, normal LAP.  Calculated EF 53%.  Aortic valve not well visualized. Moderate (grade II) aortic  regurgitation.  Mild to moderate mitral regurgitation.  Normal right atrial pressure.  Heart Catheterization:   Assessment & Plan:   Chronic obstructive pulmonary disease, unspecified COPD type ([place]) - Plan: Pulmonary Function Test, DG Chest 2 View  Dysphagia, unspecified type - Plan: SLP modified barium swallow  Discussion: Morgan Johnson is a 69 year old woman, former smoker with history of COPD, hypertension, hypothyroidism/goiter,  rheumatoid arthritis who is referred to pulmonary clinic for pre-op respiratory evaluation for pending hip replacement.   She is asymptomatic from a respiratory standpoint and has adequate functional status without the need for inhaler therapy. Her chest radiograph is unremarkable today and she will be setup for pulmonary function testing for complete evaluation. She is deemed low risk for peri-operative respiratory complications.   Based on her recent history of dysphagia, we will further evaluate this with a modified barium swallow. This should not hold up her operation as she has no history of pneumonias or aspiration from dysphagia at this time.   She can follow up as needed.  Freda Jackson, MD [facility] Office: [phone]   Current Outpatient Medications:  .  cyclobenzaprine (FLEXERIL) 10 MG tablet, Take 10 mg by mouth 3 (three) times daily as needed for muscle spasms., Disp: , Rfl:  .  hydroxychloroquine (PLAQUENIL) 200 MG tablet, Take 200 mg by mouth daily., Disp: , Rfl:  .  levothyroxine (SYNTHROID) 50 MCG tablet, Take 50 mcg by mouth daily before breakfast., Disp: , Rfl:

## 2020-09-29 ENCOUNTER — Other Ambulatory Visit (INDEPENDENT_AMBULATORY_CARE_PROVIDER_SITE_OTHER): Payer: Federal, State, Local not specified - PPO

## 2020-09-29 ENCOUNTER — Ambulatory Visit: Payer: Federal, State, Local not specified - PPO | Admitting: Nurse Practitioner

## 2020-09-29 ENCOUNTER — Encounter: Payer: Self-pay | Admitting: Nurse Practitioner

## 2020-09-29 VITALS — BP 102/70 | HR 111 | Ht 66.0 in | Wt 156.6 lb

## 2020-09-29 DIAGNOSIS — D509 Iron deficiency anemia, unspecified: Secondary | ICD-10-CM | POA: Diagnosis not present

## 2020-09-29 LAB — IBC + FERRITIN
Ferritin: 5.1 ng/mL — ABNORMAL LOW (ref 10.0–291.0)
Iron: 25 ug/dL — ABNORMAL LOW (ref 42–145)
Saturation Ratios: 5.7 % — ABNORMAL LOW (ref 20.0–50.0)
Transferrin: 312 mg/dL (ref 212.0–360.0)

## 2020-09-29 LAB — HEPATIC FUNCTION PANEL
ALT: 11 U/L (ref 0–35)
AST: 15 U/L (ref 0–37)
Albumin: 4 g/dL (ref 3.5–5.2)
Alkaline Phosphatase: 56 U/L (ref 39–117)
Bilirubin, Direct: 0 mg/dL (ref 0.0–0.3)
Total Bilirubin: 0.3 mg/dL (ref 0.2–1.2)
Total Protein: 7.2 g/dL (ref 6.0–8.3)

## 2020-09-29 NOTE — Progress Notes (Signed)
Agree with assessment and plan as outlined.  

## 2020-09-29 NOTE — Patient Instructions (Addendum)
If you are age 70 or older, your body mass index should be between 23-30. Your Body mass index is 25.28 kg/m. If this is out of the aforementioned range listed, please consider follow up with your Primary Care Provider.  If you are age 19 or younger, your body mass index should be between 19-25. Your Body mass index is 25.28 kg/m. If this is out of the aformentioned range listed, please consider follow up with your Primary Care Provider.   Your provider has requested that you go to the basement level for lab work before leaving today. Press "B" on the elevator. The lab is located at the first door on the left as you exit the elevator.  Due to recent changes in healthcare laws, you may see the results of your imaging and laboratory studies on MyChart before your provider has had a chance to review them.  We understand that in some cases there may be results that are confusing or concerning to you. Not all laboratory results come back in the same time frame and the provider may be waiting for multiple results in order to interpret others.  Please give [place] 48 hours in order for your provider to thoroughly review all the results before contacting the office for clarification of your results.   Thank you for entrusting me with your care and choosing [facility]  Tye Savoy, NP

## 2020-09-29 NOTE — Progress Notes (Signed)
ASSESSMENT AND PLAN    # Referred for abnormal liver tests but last labs in July were normal. Patient awaiting hip surgery. There is confusion about the nature of this consult. We called Orthopedics for clarity. It seems that patient mentioned to someone in pre-op visit at Ortho office that she had liver problems and so was sent here for clearance?  --Will obtain LFTs today. If normal then would ask that Dr. Debroah Loop office provide [place] with additional information so we can fully address his concerns.    # Microcytic anemia. I did note that patient has microcytic anemia.  She has CKD which is probably contributing to anemia. No overt GI bleeding but she hasn't had colon cancer screening in years. Additionally she has been taking NSAIDS.  --It makes since to see if iron deficient. Will obtain iron studies.  --Patient tells me the anemia is chronic but at some point she still needs a colonoscopy and possibly EGD given NSAID use ( now stopped). However, patient resides in the [place], she is only here to get her hip surgery done and then returning home  We are happy to evaluate the microcytic anemia following recovery from hip surgery or before surgery if Ortho prefers.   HISTORY OF PRESENT ILLNESS     Primary Gastroenterologist : New Chief Complaint : clearance for hip surgery.   Morgan Johnson is a 69 y.o. female with PMH / [place] significant for,  but not necessarily limited KD:TOIZ, alcoholism, thyroid disease, glaucoma, breast cancer, CKD 3, rheumatoid arthritis, HTN,  depression. hysterectomy  Patient lives in the [place], she has family here and came to [place] to get hip surgery. Patient is referred by Dr. Percell Miller, apparently for abnormal liver enzymes. She needs clearance for hip surgery.   I reviewed records from Dr. Debroah Loop office.  Included in the packet of records or labs from July 2021 from the [place].  Her liver tests were completely normal with an alkaline  phosphatase of 59, ALT 19, AST 25, total bilirubin 0.5, albumin 3.5.  I do not any more recent labs.  Patient does not know why she is here.  We called Dr. Debroah Loop office to get clarification for the consultation.  At the time of patient's preop visit with Ortho, she apparently mentioned history of liver disease and thus was sent to [place].  Patient denies any history of liver disease.   I did notice in the July labs from the [place] that patient's hemoglobin was 10.5, MCV 71.  Patient says she has a history of anemia, followed by nephrology.  She denies overt GI blood loss.  Patient was taking Motrin a couple times a week but nephrology has just changed her to Tylenol.  Patient has no GI symptoms such as bowel changes, abdominal pain.  She denies family history colon cancer.  Patient has not had any unintentional weight loss.  She cannot remember when her last colonoscopy was but says it was many many years ago.  Sounds like she did a home colon cancer screening test approximately 6 years ago and it was apparently normal.    Past Medical History:  Diagnosis Date  . Alcoholism ([place])   . Breast cancer ([place])   . COPD (chronic obstructive pulmonary disease) ([place])   . Depression   . Glaucoma   . Hypertension   . Hypothyroidism    goiter on thyroid  . Rheumatoid arthritis ([place]) 2020  . Thyroid disease  Past Surgical History:  Procedure Laterality Date  . MASTECTOMY Left 11/06/1985  . TOTAL ABDOMINAL HYSTERECTOMY     Family History  Problem Relation Age of Onset  . Heart attack Mother   . Stroke Father   . Diabetes Brother   . Stroke Brother   . Diabetes Brother   . Heart disease Brother   . Hepatitis C Brother        Liver transplant  . Kidney disease Brother        Kidney transplant  . Colon cancer Neg Hx   . Stomach cancer Neg Hx   . Liver disease Neg Hx   . Pancreatic cancer Neg Hx   . Esophageal cancer Neg Hx    Social History   Tobacco Use  . Smoking status:  Former Smoker    Packs/day: 1.00    Years: 28.00    Pack years: 28.00    Types: Cigarettes    Quit date: 2008    Years since quitting: 13.8  . Smokeless tobacco: Never Used  Vaping Use  . Vaping Use: Never used  Substance Use Topics  . Alcohol use: Not Currently  . Drug use: Never   Current Outpatient Medications  Medication Sig Dispense Refill  . cyclobenzaprine (FLEXERIL) 10 MG tablet Take 10 mg by mouth daily.     . Ergocalciferol (VITAMIN D2) 50 MCG (2000 UT) TABS Take 1 tablet by mouth daily.    . hydroxychloroquine (PLAQUENIL) 200 MG tablet Take 400 mg by mouth daily.     Marland Kitchen levothyroxine (SYNTHROID) 50 MCG tablet Take 50 mcg by mouth daily before breakfast.    . alendronate (FOSAMAX) 70 MG tablet Take 70 mg by mouth once a week. Take with a full glass of water on an empty stomach. (Patient not taking: Reported on 09/29/2020)     No current facility-administered medications for this visit.   Allergies  Allergen Reactions  . Penicillins Rash    Review of Systems: Positive for arthritis, vision changes, depression and sleeping problems  All other systems reviewed and negative except where noted in HPI.   PHYSICAL EXAM :    Wt Readings from Last 3 Encounters:  09/29/20 156 lb 9.6 oz (71 kg)  09/19/20 157 lb 9.6 oz (71.5 kg)  09/09/20 155 lb 6.4 oz (70.5 kg)    BP 102/70   Pulse (!) 111   Ht 5\' 6"  (1.676 m)   Wt 156 lb 9.6 oz (71 kg)   LMP  (LMP Unknown)   SpO2 98%   BMI 25.28 kg/m  Constitutional:  Pleasant female in no acute distress. Psychiatric: Normal mood and affect. Behavior is normal. EENT: Pupils normal.  Conjunctivae are normal. No scleral icterus. Neck supple.  Cardiovascular: Normal rate, regular rhythm. No edema Pulmonary/chest: Effort normal and breath sounds normal. No wheezing, rales or rhonchi. Abdominal: Soft, nondistended, nontender. Bowel sounds active throughout. There are no masses palpable. No hepatomegaly. Neurological: Alert and  oriented to person place and time. Skin: Skin is warm and dry. No rashes noted.  Tye Savoy, NP  09/29/2020, 11:45 AM  Cc:  Referring Provider Renette Butters, MD

## 2020-09-30 ENCOUNTER — Telehealth: Payer: Self-pay | Admitting: Pulmonary Disease

## 2020-09-30 ENCOUNTER — Ambulatory Visit (HOSPITAL_COMMUNITY)
Admission: RE | Admit: 2020-09-30 | Discharge: 2020-09-30 | Disposition: A | Discharge status: Home / Self Care | Payer: Federal, State, Local not specified - PPO | Source: Ambulatory Visit | Attending: Pulmonary Disease | Admitting: Pulmonary Disease

## 2020-09-30 ENCOUNTER — Other Ambulatory Visit: Payer: Self-pay

## 2020-09-30 ENCOUNTER — Other Ambulatory Visit (INDEPENDENT_AMBULATORY_CARE_PROVIDER_SITE_OTHER): Payer: Federal, State, Local not specified - PPO

## 2020-09-30 DIAGNOSIS — D509 Iron deficiency anemia, unspecified: Secondary | ICD-10-CM | POA: Diagnosis not present

## 2020-09-30 DIAGNOSIS — R1310 Dysphagia, unspecified: Secondary | ICD-10-CM | POA: Diagnosis not present

## 2020-09-30 LAB — CBC WITH DIFFERENTIAL/PLATELET
Basophils Absolute: 0.1 10*3/uL (ref 0.0–0.1)
Basophils Relative: 1.2 % (ref 0.0–3.0)
Eosinophils Absolute: 0.1 10*3/uL (ref 0.0–0.7)
Eosinophils Relative: 1 % (ref 0.0–5.0)
HCT: 31 % — ABNORMAL LOW (ref 36.0–46.0)
Hemoglobin: 9.9 g/dL — ABNORMAL LOW (ref 12.0–15.0)
Lymphocytes Relative: 32.9 % (ref 12.0–46.0)
Lymphs Abs: 3.4 10*3/uL (ref 0.7–4.0)
MCHC: 31.8 g/dL (ref 30.0–36.0)
MCV: 77.8 fl — ABNORMAL LOW (ref 78.0–100.0)
Monocytes Absolute: 1.1 10*3/uL — ABNORMAL HIGH (ref 0.1–1.0)
Monocytes Relative: 11 % (ref 3.0–12.0)
Neutro Abs: 5.6 10*3/uL (ref 1.4–7.7)
Neutrophils Relative %: 53.9 % (ref 43.0–77.0)
Platelets: 368 10*3/uL (ref 150.0–400.0)
RBC: 3.99 Mil/uL (ref 3.87–5.11)
RDW: 16.4 % — ABNORMAL HIGH (ref 11.5–15.5)
WBC: 10.4 10*3/uL (ref 4.0–10.5)

## 2020-09-30 NOTE — Telephone Encounter (Signed)
Called and spoke with pt letting her know the results of the swallowing study and she verbalized understanding. Pt declined GI referral at this time. Nothing further needed.

## 2020-09-30 NOTE — Telephone Encounter (Signed)
Please let the patient know that her swallowing study was normal. If she would like her swallowing issues further evaluated we can send her to GI for further evaluation.   Thanks, Wille Glaser

## 2020-10-04 ENCOUNTER — Telehealth: Payer: Self-pay | Admitting: Nurse Practitioner

## 2020-10-04 NOTE — Telephone Encounter (Signed)
Morgan Johnson, At this moment Dr Havery Moros does not have a double opening on his [place] schedule to accommodate an EGD/colon until 11/07/20. She is getting PFT's through Pulmonology to clear her for surgery.  She is calling us to inquire about the plan. Thanks

## 2020-10-05 NOTE — Telephone Encounter (Signed)
As requested by the patient, I have called the sister and informed her of the plan at this time. She states the kidney doctor told the patient to begin a daily OTC iron supplement. Family is satisfied with this plan.

## 2020-10-05 NOTE — Telephone Encounter (Signed)
Okay thank you for clarifying, appreciate it, sounds good

## 2020-10-05 NOTE — Telephone Encounter (Signed)
Yes, the orthopedists are aware that we are offering to work her up. The plan is that they will let us know how they want to proceed. In the meanwhile her Nephrologist has instructed her to take the oral iron. We are waiting for Ortho to decide on how they wish to proceed is my understanding. The PA , [name], of [facility] is going to address this with Dr Percell Miller.

## 2020-10-05 NOTE — Telephone Encounter (Signed)
Beth, I don't know iwhat Ortho's decision was about whether they were going to proceed with or hold off on surgery in light of her iron deficiency anemia.  The PA [name]) was going to talk to Dr. Percell Miller. I would expect them to let us know if they want [place] to proceed with EGD / colonoscopy prior to Ortho surgery. Thanks

## 2020-10-05 NOTE — Telephone Encounter (Signed)
Beth, thanks. Can you clarify the plan? Are we doing to proceed with EGD and colonoscopy prior to her orthopedic surgery? I think that would be safest plan. I know they want to get her surgery done soon however, they really need to discuss this with orthopedic surgeon prior to making a decision. Thanks

## 2020-10-07 ENCOUNTER — Telehealth: Payer: Self-pay | Admitting: Nurse Practitioner

## 2020-10-10 NOTE — Telephone Encounter (Signed)
Ms. Morgan Johnson is advised that Tye Savoy, NP ahs spoken with [name] at [facility]. We have offered to work up the anemia if Dr Percell Miller wants prior to her surgery or after her surgery.  She tells me the patient is cleared by everyone except PCP and Nephrology.

## 2020-10-12 ENCOUNTER — Ambulatory Visit: Payer: Federal, State, Local not specified - PPO | Admitting: Pulmonary Disease

## 2020-10-12 ENCOUNTER — Ambulatory Visit: Payer: Federal, State, Local not specified - PPO | Admitting: Acute Care

## 2020-10-12 ENCOUNTER — Other Ambulatory Visit: Payer: Self-pay

## 2020-10-12 ENCOUNTER — Encounter: Payer: Self-pay | Admitting: Acute Care

## 2020-10-12 VITALS — BP 122/74 | HR 101 | Temp 97.8°F | Ht 66.0 in | Wt 155.8 lb

## 2020-10-12 DIAGNOSIS — J449 Chronic obstructive pulmonary disease, unspecified: Secondary | ICD-10-CM | POA: Diagnosis not present

## 2020-10-12 DIAGNOSIS — Z01811 Encounter for preprocedural respiratory examination: Secondary | ICD-10-CM | POA: Diagnosis not present

## 2020-10-12 DIAGNOSIS — K224 Dyskinesia of esophagus: Secondary | ICD-10-CM | POA: Diagnosis not present

## 2020-10-12 NOTE — Patient Instructions (Addendum)
It is good to see you today. Your PFT's were normal You have been cleared for surgery by Dr. Erin Fulling from a pulmonary standpoint.  Morgan Johnson with your surgery.  Please follow up with GI to further evaluate your esophageal dysmotility.  Please be aggressive in regard to coughing and deep breathing , getting out of bed to chair, and using an incentive spirometer after surgery to decrease risk of complications.  Follow up as needed Please contact office for sooner follow up if symptoms do not improve or worsen or seek emergency care

## 2020-10-12 NOTE — Progress Notes (Signed)
PFT done today. 

## 2020-10-12 NOTE — Progress Notes (Signed)
History of Present Illness Morgan Johnson is a 69 y.o. female  former smoker ( Quit 2008, with a 28 pack year smoking history) with history of COPD( No spirometry) , hypertension, hypothyroidism/goiter, rheumatoid arthritis who was referred to pulmonary clinic for pre-op respiratory evaluation for pending hip replacement. She was seen by Dr. Erin Fulling.  Pt.  is asymptomatic from a respiratory standpoint and has adequate functional status without the need for inhaler therapy. Chest radiograph was  unremarkable 09/19/2020 and she was be setup for pulmonary function testing for complete evaluation. She is deemed low risk for peri-operative respiratory complications.   She was diagnosed with rheumatoid arthritis last year and has been on hydroxychloroquine with improvement of her joint pains/swelling in her hands and feet.  Based on her recent history of dysphagia, Dr. Erin Fulling  Plans to  further evaluate this with a modified barium swallow. This should not hold up her operation as she has no history of pneumonias or aspiration from dysphagia at this time.  She denies fever, chest pain, orthopnea or hemoptysis    10/13/2020  Pt. Is here to follow up , as she had PFT's as part of pre-op eval by Dr. Erin Fulling . PFT's today show normal spirometry. She states she is doing well. She has been double vaccinated against Covid. She will get her Booster at 6 months. He swallow eval was negative for aspiration, but she may have some esophageal dysmotility or a stricture. Recommendation was for GI follow up to evaluate further. She states she will do that after her surgery. She needs to see one more MD before she is cleared for surgery. Deni  Test Results: PFT 10/12/2020        CXR 09/19/2020 Cardiac shadow is within normal limits. The lungs are well aerated bilaterally. No focal infiltrate or sizable effusion is seen. Chronic appearing thoracic wedging is noted. No acute bony abnormality is  seen.  IMPRESSION: No acute abnormality noted.  Labs: 06/16/20 CBC: WBC 11.83, HGB 10.5, HCT 34,  CMP: Na 142, K 3.4, Cl 105, Bicarb 23, BUN 14, Cr 1.3, AST 25, ALT 19, T.Bili 0.5, Albumin 3.5 TSH 4.31   Echo: 09/13/20 Left ventricle cavity is normal in size and wall thickness. Normal global  wall motion. Normal LV systolic function with visual EF 55-60%. Doppler  evidence of grade I (impaired) diastolic dysfunction, normal LAP.  Calculated EF 53%.  Aortic valve not well visualized. Moderate (grade II) aortic  regurgitation.  Mild to moderate mitral regurgitation.  Normal right atrial pressure.   Swallow Evaluation 09/30/2020 Pt presents with normal oropharyngeal function marked by adequate mastication, timely swallow initiation, normal pharyngeal stripping with no residue, and reliable laryngeal-vestibule closure. A prominent cricopharyngeus was evident.  A barium pill was noted to hesitate in the esophagus, requiring liquid wash to pass through LES.  Pt may benefit from further assessment of the esophagus to determine dysmotility vs stricture.  D/W pt, who will f/u with referring physician.  No SLP recommendations.    SLP Visit Diagnosis Dysphagia, unspecified      CBC Latest Ref Rng & Units 09/30/2020  WBC 4.0 - 10.5 K/uL 10.4  Hemoglobin 12.0 - 15.0 g/dL 9.9(L)  Hematocrit 36 - 46 % 31.0(L)  Platelets 150 - 400 K/uL 368.0    No flowsheet data found.  BNP No results found for: BNP  ProBNP No results found for: PROBNP  PFT No results found for: FEV1PRE, FEV1POST, FVCPRE, FVCPOST, TLC, DLCOUNC, PREFEV1FVCRT, PSTFEV1FVCRT  DG Chest 2 View  Result Date: 09/19/2020 CLINICAL DATA:  COPD EXAM: CHEST - 2 VIEW COMPARISON:  None. FINDINGS: Cardiac shadow is within normal limits. The lungs are well aerated bilaterally. No focal infiltrate or sizable effusion is seen. Chronic appearing thoracic wedging is noted. No acute bony abnormality is seen. IMPRESSION: No acute  abnormality noted. Electronically Signed   By: Inez Catalina M.D.   On: 09/19/2020 16:13   DG SWALLOW FUNC OP MEDICARE SPEECH PATH  Result Date: 09/30/2020 Objective Swallowing Evaluation: Type of Study: MBS-Modified Barium Swallow Study  Patient Details Name: Morgan Johnson MRN: [id number] Date of Birth: [birth year] Today's Date: 09/30/2020 Time: SLP Start Time (ACUTE ONLY): 1105 -SLP Stop Time (ACUTE ONLY): 1125 SLP Time Calculation (min) (ACUTE ONLY): 20 min Past Medical History: Past Medical History: Diagnosis Date . Alcoholism ([place])  . Breast cancer ([place])  . COPD (chronic obstructive pulmonary disease) ([place])  . Depression  . Glaucoma  . Hypertension  . Hypothyroidism   goiter on thyroid . Rheumatoid arthritis ([place]) 2020 . Thyroid disease  Past Surgical History: Past Surgical History: Procedure Laterality Date . MASTECTOMY Left 11/06/1985 . TOTAL ABDOMINAL HYSTERECTOMY   HPI: Morgan Johnson is a 69 year old woman, former smoker with history of COPD, hypertension, hypothyroidism/goiter, rheumatoid arthritis who was referred for OP MBS due to recent complaints of food getting stuck in her throat with solid foods (breads/meats) and no issues with liquids. She has most trouble with breads but has to make sure meats are finely chewed in order to swallow appropriately. She has noted these symptoms for the past 6 months. She was referred by [facility].  Subjective: good historian Assessment / Plan / Recommendation CHL IP CLINICAL IMPRESSIONS 09/30/2020 Clinical Impression Pt presents with normal oropharyngeal function marked by adequate mastication, timely swallow initiation, normal pharyngeal stripping with no residue, and reliable laryngeal-vestibule closure. A prominent cricopharyngeus was evident.  A barium pill was noted to hesitate in the esophagus, requiring liquid wash to pass through LES.  Pt may benefit from further assessment of the esophagus to determine dysmotility vs stricture.  D/W pt, who will f/u  with referring physician.  No SLP recommendations.  SLP Visit Diagnosis Dysphagia, unspecified (R13.10) Attention and concentration deficit following -- Frontal lobe and executive function deficit following -- Impact on safety and function No limitations   CHL IP TREATMENT RECOMMENDATION 09/30/2020 Treatment Recommendations No treatment recommended at this time   No flowsheet data found. CHL IP DIET RECOMMENDATION 09/30/2020 SLP Diet Recommendations Thin liquid;Regular solids Liquid Administration via Straw;Cup Medication Administration Whole meds with liquid Compensations -- Postural Changes --   CHL IP OTHER RECOMMENDATIONS 09/30/2020 Recommended Consults Consider esophageal assessment Oral Care Recommendations Oral care BID Other Recommendations --   CHL IP FOLLOW UP RECOMMENDATIONS 09/30/2020 Follow up Recommendations None   No flowsheet data found.     CHL IP ORAL PHASE 09/30/2020 Oral Phase WFL Oral - Pudding Teaspoon -- Oral - Pudding Cup -- Oral - Honey Teaspoon -- Oral - Honey Cup -- Oral - Nectar Teaspoon -- Oral - Nectar Cup -- Oral - Nectar Straw -- Oral - Thin Teaspoon -- Oral - Thin Cup -- Oral - Thin Straw -- Oral - Puree -- Oral - Mech Soft -- Oral - Regular -- Oral - Multi-Consistency -- Oral - Pill -- Oral Phase - Comment --  CHL IP PHARYNGEAL PHASE 09/30/2020 Pharyngeal Phase WFL Pharyngeal- Pudding Teaspoon -- Pharyngeal -- Pharyngeal- Pudding Cup -- Pharyngeal -- Pharyngeal- Honey Teaspoon -- Pharyngeal -- Pharyngeal- Honey Cup --  Pharyngeal -- Pharyngeal- Nectar Teaspoon -- Pharyngeal -- Pharyngeal- Nectar Cup -- Pharyngeal -- Pharyngeal- Nectar Straw -- Pharyngeal -- Pharyngeal- Thin Teaspoon -- Pharyngeal -- Pharyngeal- Thin Cup -- Pharyngeal -- Pharyngeal- Thin Straw -- Pharyngeal -- Pharyngeal- Puree -- Pharyngeal -- Pharyngeal- Mechanical Soft -- Pharyngeal -- Pharyngeal- Regular -- Pharyngeal -- Pharyngeal- Multi-consistency -- Pharyngeal -- Pharyngeal- Pill -- Pharyngeal -- Pharyngeal  Comment --  CHL IP CERVICAL ESOPHAGEAL PHASE 09/30/2020 Cervical Esophageal Phase WFL Pudding Teaspoon -- Pudding Cup -- Honey Teaspoon -- Honey Cup -- Nectar Teaspoon -- Nectar Cup -- Nectar Straw -- Thin Teaspoon -- Thin Cup -- Thin Straw -- Puree -- Mechanical Soft -- Regular -- Multi-consistency -- Pill -- Cervical Esophageal Comment -- Juan Quam Laurice 09/30/2020, 11:54 AM            CLINICAL DATA:  Dysphagia. EXAM: MODIFIED BARIUM SWALLOW TECHNIQUE: Different consistencies of barium were administered orally to the patient by the Speech Pathologist. Imaging of the pharynx was performed in the lateral projection. The radiologist was present in the fluoroscopy room for this study, providing personal supervision. FLUOROSCOPY TIME:  Fluoroscopy Time:  58 seconds Number of Acquired Spot Images: 0 COMPARISON:  None. FINDINGS: There is no significant penetration or aspiration with any attempted consistencies. There is a prominent cricopharyngeus. Barium pill passed without difficulty. IMPRESSION: No aspiration. Please refer to the Speech Pathologists report for complete details and recommendations. Electronically Signed   By: Macy Mis M.D.   On: 09/30/2020 11:26     Past medical hx Past Medical History:  Diagnosis Date  . Alcoholism ([place])   . Breast cancer ([place])   . COPD (chronic obstructive pulmonary disease) ([place])   . Depression   . Glaucoma   . Hypertension   . Hypothyroidism    goiter on thyroid  . Rheumatoid arthritis ([place]) 2020  . Thyroid disease      Social History   Tobacco Use  . Smoking status: Former Smoker    Packs/day: 1.00    Years: 28.00    Pack years: 28.00    Types: Cigarettes    Quit date: 2008    Years since quitting: 13.8  . Smokeless tobacco: Never Used  Vaping Use  . Vaping Use: Never used  Substance Use Topics  . Alcohol use: Not Currently  . Drug use: Never    Ms.Cercone reports that she quit smoking about 13 years ago. Her smoking use included  cigarettes. She has a 28.00 pack-year smoking history. She has never used smokeless tobacco. She reports previous alcohol use. She reports that she does not use drugs.  Tobacco Cessation: Former smoker quit 2008 with a 28 pack year smoking history.  Past surgical hx, Family hx, Social hx all reviewed.  Current Outpatient Medications on File Prior to Visit  Medication Sig  . alendronate (FOSAMAX) 70 MG tablet Take 70 mg by mouth once a week. Take with a full glass of water on an empty stomach.   . cyclobenzaprine (FLEXERIL) 10 MG tablet Take 10 mg by mouth daily.   . DULoxetine (CYMBALTA) 60 MG capsule Take 60 mg by mouth daily.  . Ergocalciferol (VITAMIN D2) 50 MCG (2000 UT) TABS Take 1 tablet by mouth daily.  . ferrous sulfate 324 MG TBEC Take 324 mg by mouth.  . hydroxychloroquine (PLAQUENIL) 200 MG tablet Take 400 mg by mouth daily.   Marland Kitchen levothyroxine (SYNTHROID) 50 MCG tablet Take 50 mcg by mouth daily before breakfast.  . NIFEdipine (PROCARDIA-XL/NIFEDICAL-XL) 30 MG 24  hr tablet Take by mouth.  . risperiDONE (RISPERDAL) 3 MG tablet Take by mouth.   No current facility-administered medications on file prior to visit.     Allergies  Allergen Reactions  . Penicillins Rash    Review Of Systems:  Constitutional:   No  weight loss, night sweats,  Fevers, chills, fatigue, or  lassitude.  HEENT:   No headaches,  Difficulty swallowing,  Tooth/dental problems, or  Sore throat,                No sneezing, itching, ear ache, nasal congestion, post nasal drip,   CV:  No chest pain,  Orthopnea, PND, swelling in lower extremities, anasarca, dizziness, palpitations, syncope.   GI  No heartburn, indigestion, abdominal pain, nausea, vomiting, diarrhea, change in bowel habits, loss of appetite, bloody stools.   Resp: No shortness of breath with exertion or at rest.  No excess mucus, no productive cough,  No non-productive cough,  No coughing up of blood.  No change in color of mucus.  No  wheezing.  No chest wall deformity  Skin: no rash or lesions.  GU: no dysuria, change in color of urine, no urgency or frequency.  No flank pain, no hematuria   MS:  No joint pain or swelling.  + decreased range of motion.  No back pain.  Psych:  No change in mood or affect. No depression or anxiety.  No memory loss.   Vital Signs BP 122/74 (BP Location: Left Arm, Cuff Size: Normal)   Pulse (!) 101   Temp 97.8 F (36.6 C) (Oral)   Ht 5\' 6"  (1.676 m)   Wt 155 lb 12.8 oz (70.7 kg)   SpO2 98%   BMI 25.15 kg/m    Physical Exam:  General- No distress,  A&Ox3, plkeasant ENT: No sinus tenderness, TM clear, pale nasal mucosa, no oral exudate,no post nasal drip, no LAN Cardiac: S1, S2, regular rate and rhythm, no murmur Chest: No wheeze/ rales/ dullness; no accessory muscle use, no nasal flaring, no sternal retractions Abd.: Soft Non-tender, ND, BS +, Body mass index is 25.15 kg/m. Ext: No clubbing cyanosis, edema, decreased motion right leg 2/2 hip pain Neuro:  A&O x 3, MAE x 4, limited mobility 2/2 hip pain, appriopriate Skin: No rashes, No lesions, warm and dry Psych: normal mood and behavior   Assessment/Plan Surgical Clearance for Hip Replacement Normal PFT's Swallow eval negative for aspiration, but ? For esophageal dysmotility/ stricture Plan Your PFT's were normal You have been cleared for surgery by Dr. Erin Fulling from a pulmonary standpoint.  Emmit Alexanders with your surgery.  Please be aggressive in regard to coughing and deep breathing , getting out of bed to chair, and using an incentive spirometer after surgery to decrease risk of pulmonary complications.  Please follow up with GI to further evaluate your esophageal dysmotility as this can increase risk of aspiration.  Follow up as needed Please contact office for sooner follow up if symptoms do not improve or worsen or seek emergency care     Magdalen Spatz, NP 10/13/2020  11:04 AM

## 2020-10-13 ENCOUNTER — Encounter: Payer: Self-pay | Admitting: Acute Care

## 2020-10-13 LAB — PULMONARY FUNCTION TEST
DL/VA % pred: 93 %
DL/VA: 3.82 ml/min/mmHg/L
DLCO cor % pred: 92 %
DLCO cor: 19.34 ml/min/mmHg
DLCO unc % pred: 81 %
DLCO unc: 16.88 ml/min/mmHg
FEF 25-75 Post: 2.07 L/sec
FEF 25-75 Pre: 1.64 L/sec
FEF2575-%Change-Post: 26 %
FEF2575-%Pred-Post: 102 %
FEF2575-%Pred-Pre: 81 %
FEV1-%Change-Post: 4 %
FEV1-%Pred-Post: 95 %
FEV1-%Pred-Pre: 91 %
FEV1-Post: 2.35 L
FEV1-Pre: 2.25 L
FEV1FVC-%Change-Post: 2 %
FEV1FVC-%Pred-Pre: 98 %
FEV6-%Change-Post: 2 %
FEV6-%Pred-Post: 98 %
FEV6-%Pred-Pre: 96 %
FEV6-Post: 3.04 L
FEV6-Pre: 2.98 L
FEV6FVC-%Change-Post: 0 %
FEV6FVC-%Pred-Post: 104 %
FEV6FVC-%Pred-Pre: 103 %
FVC-%Change-Post: 1 %
FVC-%Pred-Post: 94 %
FVC-%Pred-Pre: 92 %
FVC-Post: 3.05 L
FVC-Pre: 3.01 L
Post FEV1/FVC ratio: 77 %
Post FEV6/FVC ratio: 100 %
Pre FEV1/FVC ratio: 75 %
Pre FEV6/FVC Ratio: 99 %
RV % pred: 107 %
RV: 2.47 L
TLC % pred: 102 %
TLC: 5.48 L

## 2020-11-01 ENCOUNTER — Telehealth: Payer: Self-pay | Admitting: Nurse Practitioner

## 2020-11-01 NOTE — Telephone Encounter (Signed)
Spoke with the sister and with Claiborne Billings who is the surgery scheduler at [facility] [employer]. The Orthopedic surgeon plans to have the new PCP check the patient's CBC at the visit which is coming up in a week. Kelly's understanding is if the hgb has not improved on oral iron, the egd/colonoscopy will be re-visited.  This is Pharmacist, hospital

## 2020-11-02 NOTE — Telephone Encounter (Signed)
Thanks for letting  me know. We have made recommendations about evaluation of iron deficiency anemia. She needs an EGD / colonoscopy.

## 2020-11-16 NOTE — H&P (Addendum)
HIP ARTHROPLASTY ADMISSION H&P  Patient ID: Morgan Johnson MRN: [id number] DOB/AGE: 01-30-1951 69 y.o.  Chief Complaint: right hip pain.  Planned Procedure Date: 11/29/20 Medical Clearance by Dr. Rayann Heman   Cardiac Clearance by Dr. Devoria Albe Additional clearance by  GI-Dr. Luanna Salk- Dr. Erin Fulling Ne[facility]- [facility]  HPI: Morgan Johnson is a 69 y.o. 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 The patient has a history of pain and functional disability in the right hip due to arthritis and has failed non-surgical conservative treatments for greater than 12 weeks to include NSAID's and/or analgesics and activity modification.  Onset of symptoms was gradual, starting >10 years ago with gradually worsening course since that time. The patient noted no past surgery on the right hip.  Patient currently rates pain at 8 out of 10 with activity. Patient has night pain, worsening of pain with activity and weight bearing, pain that interferes with activities of daily living, pain with passive range of motion and joint swelling.  Patient has evidence of joint space narrowing by imaging studies.  There is no active infection.  Past Medical History:  Diagnosis Date  . Alcoholism ([place])   . Breast cancer ([place])   . COPD (chronic obstructive pulmonary disease) ([place])   . Depression   . Glaucoma   . Hypertension   . Hypothyroidism    goiter on thyroid  . Rheumatoid arthritis ([place]) 2020  . Thyroid disease    Past Surgical History:  Procedure Laterality Date  . MASTECTOMY Left 11/06/1985  . TOTAL ABDOMINAL HYSTERECTOMY     Allergies  Allergen Reactions  . Penicillins Rash   Prior to Admission medications   Medication Sig Start Date End Date Taking? Authorizing Provider  Calcium Carb-Cholecalciferol (CALCIUM 600/VITAMIN D3 PO) Take 1 tablet by mouth daily.   Yes [provider]  cyclobenzaprine (FLEXERIL) 10 MG tablet Take 10 mg by mouth 3 (three) times daily as needed for  muscle spasms.   Yes [provider]  DULoxetine (CYMBALTA) 60 MG capsule Take 60 mg by mouth daily. 10/10/20  Yes [provider]  ferrous sulfate 325 (65 FE) MG tablet Take 325 mg by mouth daily with breakfast.   Yes [provider]  hydroxychloroquine (PLAQUENIL) 200 MG tablet Take 400 mg by mouth daily.    Yes [provider]  lansoprazole (PREVACID) 15 MG capsule Take 15 mg by mouth daily.   Yes [provider]  levothyroxine (SYNTHROID) 50 MCG tablet Take 50 mcg by mouth daily before breakfast.   Yes [provider]  NIFEdipine (PROCARDIA-XL/NIFEDICAL-XL) 30 MG 24 hr tablet Take 30 mg by mouth daily as needed (high blood pressure).   Yes [provider]  risperiDONE (RISPERDAL) 3 MG tablet Take 3 mg by mouth at bedtime as needed (sleep).   Yes [provider]  alendronate (FOSAMAX) 70 MG tablet Take 70 mg by mouth once a week. Take with a full glass of water on an empty stomach.     [provider]   Social History   Socioeconomic History  . Marital status: Single    Spouse name: Not on file  . Number of children: 1  . Years of education: Not on file  . Highest education level: Not on file  Occupational History  . Not on file  Tobacco Use  . Smoking status: Former Smoker    Packs/day: 1.00    Years: 28.00    Pack years: 28.00    Types: Cigarettes  Quit date: 2008    Years since quitting: 13.9  . Smokeless tobacco: Never Used  Vaping Use  . Vaping Use: Never used  Substance and Sexual Activity  . Alcohol use: Not Currently  . Drug use: Never  . Sexual activity: Not on file  Other Topics Concern  . Not on file  Social History Narrative  . Not on file   Social Determinants of Health   Financial Resource Strain: Not on file  Food Insecurity: Not on file  Transportation Needs: Not on file  Physical Activity: Not on file  Stress: Not on file  Social Connections: Not on file   Family  History  Problem Relation Age of Onset  . Heart attack Mother   . Stroke Father   . Diabetes Brother   . Stroke Brother   . Diabetes Brother   . Heart disease Brother   . Hepatitis C Brother        Liver transplant  . Kidney disease Brother        Kidney transplant  . Colon cancer Neg Hx   . Stomach cancer Neg Hx   . Liver disease Neg Hx   . Pancreatic cancer Neg Hx   . Esophageal cancer Neg Hx     ROS: Currently denies lightheadedness, dizziness, Fever, chills, CP, SOB.   No personal history of DVT, PE, MI, or CVA. No loose teeth or dentures All other systems have been reviewed and were otherwise currently negative with the exception of those mentioned in the HPI and as above.  Objective: Vitals: Ht: 66 Wt: 164 lbs Temp: 97.4 BP: 121/65 Pulse: 94 O2 94% on room air.   Physical Exam: General: AAO x 4, NAD HEENT: EOMI, Trachea midline, Head is AT/[place] Pulm: No increased work of breathing.  Clear B/L A/P w/o crackle or wheeze.  CV: RRR, No m/g/r appreciated  GI: soft, NT, ND Neuro: Neuro without gross focal deficit.  Sensation intact distally Skin: No lesions in the area of chief complaint MSK/Surgical Site: Right Hip pain with passive ROM.  Positive Stinchfield.  5/5 strength.  NVI.    Imaging Review Plain radiographs demonstrate severe degenerative joint disease of the right hip.   The bone quality appears to be fair for age and reported activity level.  Preoperative templating of the joint replacement has been completed, documented, and submitted to the Operating Room personnel in order to optimize intra-operative equipment management.  Assessment: OA RIGHT HIP Active Problems:   * No active hospital problems. *   Plan: Plan for Procedure(s): TOTAL HIP ARTHROPLASTY ANTERIOR APPROACH  The patient history, physical exam, clinical judgement of the provider and imaging are consistent with end stage degenerative joint disease and total joint arthroplasty is deemed  medically necessary. The treatment options including medical management, injection therapy, and arthroplasty were discussed at length. The risks and benefits of Procedure(s): TOTAL HIP ARTHROPLASTY ANTERIOR APPROACH were presented and reviewed.  The risks of nonoperative treatment, versus surgical intervention including but not limited to continued pain, aseptic loosening, stiffness, dislocation/subluxation, infection, bleeding, nerve injury, blood clots, cardiopulmonary complications, morbidity, mortality, among others were discussed. The patient verbalizes understanding and wishes to proceed with the plan.  Patient is being admitted for surgery, pain control, PT, prophylactic antibiotics, VTE prophylaxis, progressive ambulation, ADL's and discharge planning.   Dental prophylaxis discussed and recommended for 2 years postoperatively.   The patient does meet the criteria for TXA which will be used perioperatively.    ASA 81 mg BID  will be used postoperatively for DVT prophylaxis in addition to SCDs, and early ambulation.  Plan for Oxycodone, Tylenol, Celebrex for pain. Baclofen for spasm. Omeprazole for gastric protection. Zofran for post-operative nausea/vomiting.   The patient is planning to be discharged home with OPPT in care of her niece Misty  Pt. Is planned for Same day discharge.     Rachael Fee, PA-C 11/16/2020 12:16 PM

## 2020-11-21 NOTE — Patient Instructions (Addendum)
DUE TO COVID-19 ONLY ONE VISITOR IS ALLOWED TO COME WITH YOU AND STAY IN THE WAITING ROOM ONLY DURING PRE OP AND PROCEDURE DAY OF SURGERY. THE 1 VISITOR  MAY VISIT WITH YOU AFTER SURGERY IN YOUR PRIVATE ROOM DURING VISITING HOURS ONLY!  YOU NEED TO HAVE A COVID 19 TEST ON: 11/28/20 @ 10:30 AM, THIS TEST MUST BE DONE BEFORE SURGERY,  COVID TESTING SITE [place] [address], IT IS ON THE RIGHT GOING OUT [address] APPROXIMATELY  2 MINUTES PAST [employer] ON THE RIGHT. ONCE YOUR COVID TEST IS COMPLETED,  PLEASE BEGIN THE QUARANTINE INSTRUCTIONS AS OUTLINED IN YOUR HANDOUT.                Letzy Gullickson    Your procedure is scheduled on: 11/29/20   Report to [facility] Main  Entrance   Report to admitting at: 7:30 AM     Call this number if you have problems the morning of surgery [phone]    Remember:   NO SOLID FOOD AFTER MIDNIGHT THE NIGHT PRIOR TO SURGERY. NOTHING BY MOUTH EXCEPT CLEAR LIQUIDS UNTIL: 7:00 AM . PLEASE FINISH ENSURE DRINK PER SURGEON ORDER  WHICH NEEDS TO BE COMPLETED AT: 7:00 AM .  CLEAR LIQUID DIET   Foods Allowed                                                                     Foods Excluded  Coffee and tea, regular and decaf                             liquids that you cannot  Plain Jell-O any favor except red or purple                                           see through such as: Fruit ices (not with fruit pulp)                                     milk, soups, orange juice  Iced Popsicles                                    All solid food Carbonated beverages, regular and diet                                    Cranberry, grape and apple juices Sports drinks like Gatorade Lightly seasoned clear broth or consume(fat free) Sugar, honey syrup  Sample Menu Breakfast                                Lunch  Supper Cranberry juice                    Beef broth                             Chicken broth Jell-O                                     Grape juice                           Apple juice Coffee or tea                        Jell-O                                      Popsicle                                                Coffee or tea                        Coffee or tea  _____________________________________________________________________  BRUSH YOUR TEETH MORNING OF SURGERY AND RINSE YOUR MOUTH OUT, NO CHEWING GUM CANDY OR MINTS.   Take these medicines the morning of surgery with A SIP OF WATER: Duloxetine,lanzoprazole,synthroid,procardia.                               You may not have any metal on your body including hair pins and              piercings  Do not wear jewelry, make-up, lotions, powders or perfumes, deodorant             Do not wear nail polish on your fingernails.  Do not shave  48 hours prior to surgery.              Do not bring valuables to the hospital. [place].  Contacts, dentures or bridgework may not be worn into surgery.  Leave suitcase in the car. After surgery it may be brought to your room.     Patients discharged the day of surgery will not be allowed to drive home. IF YOU ARE HAVING SURGERY AND GOING HOME THE SAME DAY, YOU MUST HAVE AN ADULT TO DRIVE YOU HOME AND BE WITH YOU FOR 24 HOURS. YOU MAY GO HOME BY TAXI OR UBER OR ORTHERWISE, BUT AN ADULT MUST ACCOMPANY YOU HOME AND STAY WITH YOU FOR 24 HOURS.  Name and phone number of your driver:  Special Instructions: N/A              Please read over the following fact sheets you were given: _____________________________________________________________________          [facility] - Preparing for Surgery Before surgery, you can play an important role.  Because skin is not sterile, your skin needs to be  as free of germs as possible.  You can reduce the number of germs on your skin by washing with CHG (chlorahexidine gluconate) soap  before surgery.  CHG is an antiseptic cleaner which kills germs and bonds with the skin to continue killing germs even after washing. Please DO NOT use if you have an allergy to CHG or antibacterial soaps.  If your skin becomes reddened/irritated stop using the CHG and inform your nurse when you arrive at Short Stay. Do not shave (including legs and underarms) for at least 48 hours prior to the first CHG shower.  You may shave your face/neck. Please follow these instructions carefully:  1.  Shower with CHG Soap the night before surgery and the  morning of Surgery.  2.  If you choose to wash your hair, wash your hair first as usual with your  normal  shampoo.  3.  After you shampoo, rinse your hair and body thoroughly to remove the  shampoo.                           4.  Use CHG as you would any other liquid soap.  You can apply chg directly  to the skin and wash                       Gently with a scrungie or clean washcloth.  5.  Apply the CHG Soap to your body ONLY FROM THE NECK DOWN.   Do not use on face/ open                           Wound or open sores. Avoid contact with eyes, ears mouth and genitals (private parts).                       Wash face,  Genitals (private parts) with your normal soap.             6.  Wash thoroughly, paying special attention to the area where your surgery  will be performed.  7.  Thoroughly rinse your body with warm water from the neck down.  8.  DO NOT shower/wash with your normal soap after using and rinsing off  the CHG Soap.                9.  Pat yourself dry with a clean towel.            10.  Wear clean pajamas.            11.  Place clean sheets on your bed the night of your first shower and do not  sleep with pets. Day of Surgery : Do not apply any lotions/deodorants the morning of surgery.  Please wear clean clothes to the hospital/surgery center.  FAILURE TO FOLLOW THESE INSTRUCTIONS MAY RESULT IN THE CANCELLATION OF YOUR SURGERY PATIENT  SIGNATURE_________________________________  NURSE SIGNATURE__________________________________  ________________________________________________________________________   Adam Phenix  An incentive spirometer is a tool that can help keep your lungs clear and active. This tool measures how well you are filling your lungs with each breath. Taking long deep breaths may help reverse or decrease the chance of developing breathing (pulmonary) problems (especially infection) following:  A long period of time when you are unable to move or be active. BEFORE THE PROCEDURE   If the spirometer includes an indicator to show your best  effort, your nurse or respiratory therapist will set it to a desired goal.  If possible, sit up straight or lean slightly forward. Try not to slouch.  Hold the incentive spirometer in an upright position. INSTRUCTIONS FOR USE  1. Sit on the edge of your bed if possible, or sit up as far as you can in bed or on a chair. 2. Hold the incentive spirometer in an upright position. 3. Breathe out normally. 4. Place the mouthpiece in your mouth and seal your lips tightly around it. 5. Breathe in slowly and as deeply as possible, raising the piston or the ball toward the top of the column. 6. Hold your breath for 3-5 seconds or for as long as possible. Allow the piston or ball to fall to the bottom of the column. 7. Remove the mouthpiece from your mouth and breathe out normally. 8. Rest for a few seconds and repeat Steps 1 through 7 at least 10 times every 1-2 hours when you are awake. Take your time and take a few normal breaths between deep breaths. 9. The spirometer may include an indicator to show your best effort. Use the indicator as a goal to work toward during each repetition. 10. After each set of 10 deep breaths, practice coughing to be sure your lungs are clear. If you have an incision (the cut made at the time of surgery), support your incision when coughing  by placing a pillow or rolled up towels firmly against it. Once you are able to get out of bed, walk around indoors and cough well. You may stop using the incentive spirometer when instructed by your caregiver.  RISKS AND COMPLICATIONS  Take your time so you do not get dizzy or light-headed.  If you are in pain, you may need to take or ask for pain medication before doing incentive spirometry. It is harder to take a deep breath if you are having pain. AFTER USE  Rest and breathe slowly and easily.  It can be helpful to keep track of a log of your progress. Your caregiver can provide you with a simple table to help with this. If you are using the spirometer at home, follow these instructions: [place] IF:   You are having difficultly using the spirometer.  You have trouble using the spirometer as often as instructed.  Your pain medication is not giving enough relief while using the spirometer.  You develop fever of 100.5 F (38.1 C) or higher. SEEK IMMEDIATE MEDICAL CARE IF:   You cough up bloody sputum that had not been present before.  You develop fever of 102 F (38.9 C) or greater.  You develop worsening pain at or near the incision site. MAKE SURE YOU:   Understand these instructions.  Will watch your condition.  Will get help right away if you are not doing well or get worse. Document Released: 04/01/2007 Document Revised: 02/11/2012 Document Reviewed: 06/02/2007 [facility] Patient Information 2014 [place], [place].   ________________________________________________________________________

## 2020-11-22 ENCOUNTER — Encounter (HOSPITAL_COMMUNITY)
Admission: RE | Admit: 2020-11-22 | Discharge: 2020-11-22 | Disposition: A | Discharge status: Home / Self Care | Payer: Federal, State, Local not specified - PPO | Source: Ambulatory Visit | Attending: Orthopedic Surgery | Admitting: Orthopedic Surgery

## 2020-11-22 ENCOUNTER — Encounter (HOSPITAL_COMMUNITY): Payer: Self-pay

## 2020-11-22 ENCOUNTER — Other Ambulatory Visit: Payer: Self-pay

## 2020-11-22 DIAGNOSIS — Z01812 Encounter for preprocedural laboratory examination: Secondary | ICD-10-CM | POA: Insufficient documentation

## 2020-11-22 HISTORY — DX: Anemia, unspecified: D64.9

## 2020-11-22 LAB — CBC
HCT: 36.5 % (ref 36.0–46.0)
Hemoglobin: 11.5 g/dL — ABNORMAL LOW (ref 12.0–15.0)
MCH: 26.3 pg (ref 26.0–34.0)
MCHC: 31.5 g/dL (ref 30.0–36.0)
MCV: 83.3 fL (ref 80.0–100.0)
Platelets: 323 10*3/uL (ref 150–400)
RBC: 4.38 MIL/uL (ref 3.87–5.11)
RDW: 17.5 % — ABNORMAL HIGH (ref 11.5–15.5)
WBC: 9.5 10*3/uL (ref 4.0–10.5)
nRBC: 0 % (ref 0.0–0.2)

## 2020-11-22 LAB — BASIC METABOLIC PANEL
Anion gap: 8 (ref 5–15)
BUN: 15 mg/dL (ref 8–23)
CO2: 26 mmol/L (ref 22–32)
Calcium: 9.4 mg/dL (ref 8.9–10.3)
Chloride: 98 mmol/L (ref 98–111)
Creatinine, Ser: 0.92 mg/dL (ref 0.44–1.00)
GFR, Estimated: >60 mL/min (ref >60)
Glucose, Bld: 97 mg/dL (ref 70–99)
Potassium: 4.9 mmol/L (ref 3.5–5.1)
Sodium: 132 mmol/L — ABNORMAL LOW (ref 135–145)

## 2020-11-22 LAB — SURGICAL PCR SCREEN
MRSA, PCR: NEGATIVE
Staphylococcus aureus: NEGATIVE

## 2020-11-22 NOTE — Progress Notes (Signed)
COVID Vaccine Completed: Yes Date COVID Vaccine completed: 08/10/20 COVID vaccine manufacturer: Lynn      PCP - Posey Pronto: DO. LOV: 11/07/20 Cardiologist -   Chest x-ray - 09/19/20 EKG - 09/09/20 Stress Test -  ECHO - 09/13/20 Cardiac Cath -  Pacemaker/ICD device last checked:  Sleep Study -  CPAP -   Fasting Blood Sugar -  Checks Blood Sugar _____ times a day  Blood Thinner Instructions: Aspirin Instructions: Last Dose:  Anesthesia review: Hx  Patient denies shortness of breath, fever, cough and chest pain at PAT appointment   Patient verbalized understanding of instructions that were given to them at the PAT appointment. Patient was also instructed that they will need to review over the PAT instructions again at home before surgery.

## 2020-11-23 NOTE — Discharge Instructions (Signed)
You may bear weight as tolerated. Keep your dressing on and dry until follow up. Take medicine to prevent blood clots as directed. Take pain medicine as needed with the goal of transitioning to over the counter medicines.  If needed, you may increase breakthrough pain medication (oxycodone) for the first few days post op - up to 2 tablets every 4 hours.  Stop this medication as soon as you are able.  INSTRUCTIONS AFTER JOINT REPLACEMENT   o Remove items at home which could result in a fall. This includes throw rugs or furniture in walking pathways o ICE to the affected joint every three hours while awake for 30 minutes at a time, for at least the first 3-5 days, and then as needed for pain and swelling.  Continue to use ice for pain and swelling. You may notice swelling that will progress down to the foot and ankle.  This is normal after surgery.  Elevate your leg when you are not up walking on it.   o Continue to use the breathing machine you got in the hospital (incentive spirometer) which will help keep your temperature down.  It is common for your temperature to cycle up and down following surgery, especially at night when you are not up moving around and exerting yourself.  The breathing machine keeps your lungs expanded and your temperature down.   DIET:  As you were doing prior to hospitalization, we recommend a well-balanced diet.  DRESSING / WOUND CARE / SHOWERING  You may shower 3 days after surgery, but keep the wounds dry during showering.  You may use an occlusive plastic wrap (Press'n Seal for example) with blue painter's tape at edges, NO SOAKING/SUBMERGING IN THE BATHTUB.  If the bandage gets wet, change with a clean dry gauze.  If the incision gets wet, pat the wound dry with a clean towel.  ACTIVITY  o Increase activity slowly as tolerated, but follow the weight bearing instructions below.   o No driving for 6 weeks or until further direction given by your physician.  You  cannot drive while taking narcotics.  o No lifting or carrying greater than 10 lbs. until further directed by your surgeon. o Avoid periods of inactivity such as sitting longer than an hour when not asleep. This helps prevent blood clots.  o You may return to work once you are authorized by your doctor.     WEIGHT BEARING   Weight bearing as tolerated with assist device (walker, cane, etc) as directed, use it as long as suggested by your surgeon or therapist, typically at least 4-6 weeks.   EXERCISES  Results after joint replacement surgery are often greatly improved when you follow the exercise, range of motion and muscle strengthening exercises prescribed by your doctor. Safety measures are also important to protect the joint from further injury. Any time any of these exercises cause you to have increased pain or swelling, decrease what you are doing until you are comfortable again and then slowly increase them. If you have problems or questions, call your caregiver or physical therapist for advice.   Rehabilitation is important following a joint replacement. After just a few days of immobilization, the muscles of the leg can become weakened and shrink (atrophy).  These exercises are designed to build up the tone and strength of the thigh and leg muscles and to improve motion. Often times heat used for twenty to thirty minutes before working out will loosen up your tissues and help with   improving the range of motion but do not use heat for the first two weeks following surgery (sometimes heat can increase post-operative swelling).   These exercises can be done on a training (exercise) mat, on the floor, on a table or on a bed. Use whatever works the best and is most comfortable for you.    Use music or television while you are exercising so that the exercises are a pleasant break in your day. This will make your life better with the exercises acting as a break in your routine that you can look  forward to.   Perform all exercises about fifteen times, three times per day or as directed.  You should exercise both the operative leg and the other leg as well.  Exercises include:   . Quad Sets - Tighten up the muscle on the front of the thigh (Quad) and hold for 5-10 seconds.   . Straight Leg Raises - With your knee straight (if you were given a brace, keep it on), lift the leg to 60 degrees, hold for 3 seconds, and slowly lower the leg.  Perform this exercise against resistance later as your leg gets stronger.  . Leg Slides: Lying on your back, slowly slide your foot toward your buttocks, bending your knee up off the floor (only go as far as is comfortable). Then slowly slide your foot back down until your leg is flat on the floor again.  . Angel Wings: Lying on your back spread your legs to the side as far apart as you can without causing discomfort.  . Hamstring Strength:  Lying on your back, push your heel against the floor with your leg straight by tightening up the muscles of your buttocks.  Repeat, but this time bend your knee to a comfortable angle, and push your heel against the floor.  You may put a pillow under the heel to make it more comfortable if necessary.   A rehabilitation program following joint replacement surgery can speed recovery and prevent re-injury in the future due to weakened muscles. Contact your doctor or a physical therapist for more information on knee rehabilitation.    CONSTIPATION  Constipation is defined medically as fewer than three stools per week and severe constipation as less than one stool per week.  Even if you have a regular bowel pattern at home, your normal regimen is likely to be disrupted due to multiple reasons following surgery.  Combination of anesthesia, postoperative narcotics, change in appetite and fluid intake all can affect your bowels.   YOU MUST use at least one of the following options; they are listed in order of increasing strength  to get the job done.  They are all available over the counter, and you may need to use some, POSSIBLY even all of these options:    Drink plenty of fluids (prune juice may be helpful) and high fiber foods Colace 100 mg by mouth twice a day  Senokot for constipation as directed and as needed Dulcolax (bisacodyl), take with full glass of water  Miralax (polyethylene glycol) once or twice a day as needed.  If you have tried all these things and are unable to have a bowel movement in the first 3-4 days after surgery call either your surgeon or your primary doctor.    If you experience loose stools or diarrhea, hold the medications until you stool forms back up.  If your symptoms do not get better within 1 week or if they get worse,   check with your doctor.  If you experience "the worst abdominal pain ever" or develop nausea or vomiting, please contact the office immediately for further recommendations for treatment.   ITCHING:  If you experience itching with your medications, try taking only a single pain pill, or even half a pain pill at a time.  You can also use Benadryl over the counter for itching or also to help with sleep.   TED HOSE STOCKINGS:  Use stockings on both legs until for at least 2 weeks or as directed by physician office. They may be removed at night for sleeping.  MEDICATIONS:  See your medication summary on the "After Visit Summary" that nursing will review with you.  You may have some home medications which will be placed on hold until you complete the course of blood thinner medication.  It is important for you to complete the blood thinner medication as prescribed.  PRECAUTIONS:  If you experience chest pain or shortness of breath - call 911 immediately for transfer to the hospital emergency department.   If you develop a fever greater that 101 F, purulent drainage from wound, increased redness or drainage from wound, foul odor from the wound/dressing, or calf pain - CONTACT  YOUR SURGEON.                                                   FOLLOW-UP APPOINTMENTS:  If you do not already have a post-op appointment, please call the office for an appointment to be seen by your surgeon.  Guidelines for how soon to be seen are listed in your "After Visit Summary", but are typically between 1-4 weeks after surgery.  OTHER INSTRUCTIONS:  Dental Antibiotics:  In most cases prophylactic antibiotics for Dental procdeures after total joint surgery are not necessary.  Exceptions are as follows:  1. History of prior total joint infection  2. Severely immunocompromised (Organ Transplant, cancer chemotherapy, Rheumatoid biologic meds such as Humera)  3. Poorly controlled diabetes (A1C &gt; 8.0, blood glucose over 200)  If you have one of these conditions, contact your surgeon for an antibiotic prescription, prior to your dental procedure.   MAKE SURE YOU:  . Understand these instructions.  . Get help right away if you are not doing well or get worse.    Thank you for letting us be a part of your medical care team.  It is a privilege we respect greatly.  We hope these instructions will help you stay on track for a fast and full recovery!     

## 2020-11-28 ENCOUNTER — Other Ambulatory Visit (HOSPITAL_COMMUNITY)
Admission: RE | Admit: 2020-11-28 | Discharge: 2020-11-28 | Disposition: A | Discharge status: Home / Self Care | Payer: Federal, State, Local not specified - PPO | Source: Ambulatory Visit | Attending: Orthopedic Surgery | Admitting: Orthopedic Surgery

## 2020-11-28 DIAGNOSIS — Z87891 Personal history of nicotine dependence: Secondary | ICD-10-CM | POA: Diagnosis not present

## 2020-11-28 DIAGNOSIS — Z79890 Hormone replacement therapy (postmenopausal): Secondary | ICD-10-CM | POA: Diagnosis not present

## 2020-11-28 DIAGNOSIS — Z7983 Long term (current) use of bisphosphonates: Secondary | ICD-10-CM | POA: Diagnosis not present

## 2020-11-28 DIAGNOSIS — Z20822 Contact with and (suspected) exposure to covid-19: Secondary | ICD-10-CM | POA: Insufficient documentation

## 2020-11-28 DIAGNOSIS — Z79899 Other long term (current) drug therapy: Secondary | ICD-10-CM | POA: Diagnosis not present

## 2020-11-28 DIAGNOSIS — M1611 Unilateral primary osteoarthritis, right hip: Secondary | ICD-10-CM | POA: Diagnosis present

## 2020-11-28 DIAGNOSIS — Z01812 Encounter for preprocedural laboratory examination: Secondary | ICD-10-CM | POA: Insufficient documentation

## 2020-11-28 LAB — SARS CORONAVIRUS 2 (TAT 6-24 HRS): SARS Coronavirus 2: NEGATIVE

## 2020-11-28 NOTE — Anesthesia Preprocedure Evaluation (Addendum)
Anesthesia Evaluation  Patient identified by MRN, date of birth, ID band Patient awake    Reviewed: Allergy & Precautions, NPO status , Patient's Chart, lab work & pertinent test results  Airway Mallampati: II  TM Distance: >3 FB Neck ROM: Full    Dental no notable dental hx.    Pulmonary COPD (normal spirometry), former smoker,    Pulmonary exam normal breath sounds clear to auscultation       Cardiovascular hypertension, Normal cardiovascular exam Rhythm:Regular Rate:Normal  EKG 09/09/2020: Sinus rhythm 94 bpm  Left atrial enlargement Nonspecific T wave changes   Neuro/Psych PSYCHIATRIC DISORDERS Depression negative neurological ROS     GI/Hepatic negative GI ROS, Neg liver ROS,   Endo/Other  Hypothyroidism (goiter)   Renal/GU negative Renal ROS  negative genitourinary   Musculoskeletal  (+) Arthritis , Rheumatoid disorders,    Abdominal   Peds  Hematology  (+) anemia ,   Anesthesia Other Findings H/o breast cancer  Reproductive/Obstetrics negative OB ROS                            Anesthesia Physical Anesthesia Plan  ASA: III  Anesthesia Plan: Spinal   Post-op Pain Management:    Induction: Intravenous  PONV Risk Score and Plan: 2 and TIVA, Ondansetron, Treatment may vary due to age or medical condition and Propofol infusion  Airway Management Planned: Natural Airway and Simple Face Mask  Additional Equipment:   Intra-op Plan:   Post-operative Plan:   Informed Consent: I have reviewed the patients History and Physical, chart, labs and discussed the procedure including the risks, benefits and alternatives for the proposed anesthesia with the patient or authorized representative who has indicated his/her understanding and acceptance.     Dental advisory given  Plan Discussed with: Anesthesiologist and CRNA  Anesthesia Plan Comments:        Anesthesia Quick  Evaluation

## 2020-11-29 ENCOUNTER — Ambulatory Visit (HOSPITAL_COMMUNITY): Payer: Federal, State, Local not specified - PPO | Admitting: Anesthesiology

## 2020-11-29 ENCOUNTER — Encounter (HOSPITAL_COMMUNITY): Payer: Self-pay | Admitting: Orthopedic Surgery

## 2020-11-29 ENCOUNTER — Ambulatory Visit (HOSPITAL_COMMUNITY): Payer: Federal, State, Local not specified - PPO

## 2020-11-29 ENCOUNTER — Ambulatory Visit (HOSPITAL_COMMUNITY)
Admission: RE | Admit: 2020-11-29 | Discharge: 2020-11-29 | Disposition: A | Discharge status: Home / Self Care | Payer: Federal, State, Local not specified - PPO | Attending: Orthopedic Surgery | Admitting: Orthopedic Surgery

## 2020-11-29 ENCOUNTER — Encounter (HOSPITAL_COMMUNITY)
Admission: RE | Disposition: A | Discharge status: Home / Self Care | Payer: Self-pay | Source: Home / Self Care | Attending: Orthopedic Surgery

## 2020-11-29 DIAGNOSIS — M1611 Unilateral primary osteoarthritis, right hip: Secondary | ICD-10-CM | POA: Insufficient documentation

## 2020-11-29 DIAGNOSIS — Z20822 Contact with and (suspected) exposure to covid-19: Secondary | ICD-10-CM | POA: Insufficient documentation

## 2020-11-29 DIAGNOSIS — Z79890 Hormone replacement therapy (postmenopausal): Secondary | ICD-10-CM | POA: Insufficient documentation

## 2020-11-29 DIAGNOSIS — Z87891 Personal history of nicotine dependence: Secondary | ICD-10-CM | POA: Insufficient documentation

## 2020-11-29 DIAGNOSIS — Z7983 Long term (current) use of bisphosphonates: Secondary | ICD-10-CM | POA: Insufficient documentation

## 2020-11-29 DIAGNOSIS — Z419 Encounter for procedure for purposes other than remedying health state, unspecified: Secondary | ICD-10-CM

## 2020-11-29 DIAGNOSIS — Z79899 Other long term (current) drug therapy: Secondary | ICD-10-CM | POA: Insufficient documentation

## 2020-11-29 HISTORY — PX: TOTAL HIP ARTHROPLASTY: SHX124

## 2020-11-29 SURGERY — ARTHROPLASTY, HIP, TOTAL, ANTERIOR APPROACH
Anesthesia: Spinal | Site: Hip | Laterality: Right

## 2020-11-29 MED ORDER — CHLORHEXIDINE GLUCONATE 0.12 % MT SOLN
15.0000 mL | Freq: Once | OROMUCOSAL | Status: AC
  Administered 2020-11-29: 06:00:00 15 mL via OROMUCOSAL

## 2020-11-29 MED ORDER — OMEPRAZOLE 20 MG PO CPDR: 20.0000 mg | DELAYED_RELEASE_CAPSULE | Freq: Every day | ORAL | 0 refills | Status: AC

## 2020-11-29 MED ORDER — OXYCODONE HCL 5 MG PO TABS
ORAL_TABLET | ORAL | Status: AC
  Filled 2020-11-29: qty 1

## 2020-11-29 MED ORDER — KETAMINE HCL 10 MG/ML IJ SOLN: INTRAMUSCULAR | Status: DC | PRN

## 2020-11-29 MED ORDER — FENTANYL CITRATE (PF) 100 MCG/2ML IJ SOLN
INTRAMUSCULAR | Status: DC | PRN
  Administered 2020-11-29: 50 ug via INTRAVENOUS

## 2020-11-29 MED ORDER — PROPOFOL 500 MG/50ML IV EMUL
INTRAVENOUS | Status: DC | PRN
  Administered 2020-11-29: 75 ug/kg/min via INTRAVENOUS

## 2020-11-29 MED ORDER — LACTATED RINGERS IV BOLUS
500.0000 mL | Freq: Once | INTRAVENOUS | Status: AC
  Administered 2020-11-29: 10:00:00 500 mL via INTRAVENOUS

## 2020-11-29 MED ORDER — CEFAZOLIN SODIUM-DEXTROSE 2-4 GM/100ML-% IV SOLN: 2.0000 g | Freq: Four times a day (QID) | INTRAVENOUS | Status: DC

## 2020-11-29 MED ORDER — PHENYLEPHRINE 40 MCG/ML (10ML) SYRINGE FOR IV PUSH (FOR BLOOD PRESSURE SUPPORT)
PREFILLED_SYRINGE | INTRAVENOUS | Status: AC
  Filled 2020-11-29: qty 10

## 2020-11-29 MED ORDER — DIPHENHYDRAMINE HCL 50 MG/ML IJ SOLN: 50.0000 mg | Freq: Once | INTRAMUSCULAR | Status: DC

## 2020-11-29 MED ORDER — ORAL CARE MOUTH RINSE: 15.0000 mL | Freq: Once | OROMUCOSAL | Status: AC

## 2020-11-29 MED ORDER — ACETAMINOPHEN 500 MG PO TABS: 1000.0000 mg | ORAL_TABLET | Freq: Four times a day (QID) | ORAL | 0 refills | Status: AC | PRN

## 2020-11-29 MED ORDER — CEFAZOLIN SODIUM-DEXTROSE 2-4 GM/100ML-% IV SOLN
2.0000 g | INTRAVENOUS | Status: AC
  Administered 2020-11-29: 08:00:00 2 g via INTRAVENOUS
  Filled 2020-11-29: qty 100

## 2020-11-29 MED ORDER — PROPOFOL 10 MG/ML IV BOLUS
INTRAVENOUS | Status: AC
  Filled 2020-11-29: qty 20

## 2020-11-29 MED ORDER — PHENYLEPHRINE HCL-NACL 10-0.9 MG/250ML-% IV SOLN
INTRAVENOUS | Status: DC | PRN
  Administered 2020-11-29: 20 ug/min via INTRAVENOUS

## 2020-11-29 MED ORDER — PHENYLEPHRINE HCL (PRESSORS) 10 MG/ML IV SOLN
INTRAVENOUS | Status: AC
  Filled 2020-11-29: qty 1

## 2020-11-29 MED ORDER — BUPIVACAINE IN DEXTROSE 0.75-8.25 % IT SOLN
INTRATHECAL | Status: DC | PRN
  Administered 2020-11-29: 1.6 mL via INTRATHECAL

## 2020-11-29 MED ORDER — METHOCARBAMOL 500 MG PO TABS: 500.0000 mg | ORAL_TABLET | Freq: Four times a day (QID) | ORAL | Status: DC | PRN

## 2020-11-29 MED ORDER — WATER FOR IRRIGATION, STERILE IR SOLN
Status: DC | PRN
  Administered 2020-11-29: 2000 mL

## 2020-11-29 MED ORDER — CELECOXIB 200 MG PO CAPS: 200.0000 mg | ORAL_CAPSULE | Freq: Two times a day (BID) | ORAL | 0 refills | Status: AC

## 2020-11-29 MED ORDER — OXYCODONE HCL 5 MG/5ML PO SOLN: 5.0000 mg | Freq: Once | ORAL | Status: AC | PRN

## 2020-11-29 MED ORDER — METHOCARBAMOL 500 MG IVPB - SIMPLE MED
500.0000 mg | Freq: Four times a day (QID) | INTRAVENOUS | Status: DC | PRN
Start: 2020-11-29 — End: 2020-11-29

## 2020-11-29 MED ORDER — TRANEXAMIC ACID-NACL 1000-0.7 MG/100ML-% IV SOLN
INTRAVENOUS | Status: AC
  Filled 2020-11-29: qty 100

## 2020-11-29 MED ORDER — FENTANYL CITRATE (PF) 100 MCG/2ML IJ SOLN: 25.0000 ug | INTRAMUSCULAR | Status: DC | PRN

## 2020-11-29 MED ORDER — PROPOFOL 10 MG/ML IV BOLUS
INTRAVENOUS | Status: DC | PRN
  Administered 2020-11-29 (×2): 10 mg via INTRAVENOUS
  Administered 2020-11-29 (×2): 20 mg via INTRAVENOUS

## 2020-11-29 MED ORDER — BUPIVACAINE LIPOSOME 1.3 % IJ SUSP
10.0000 mL | Freq: Once | INTRAMUSCULAR | Status: AC
  Administered 2020-11-29: 09:00:00 10 mL
  Filled 2020-11-29: qty 10

## 2020-11-29 MED ORDER — TRANEXAMIC ACID-NACL 1000-0.7 MG/100ML-% IV SOLN
1000.0000 mg | INTRAVENOUS | Status: AC
  Administered 2020-11-29: 08:00:00 1000 mg via INTRAVENOUS
  Filled 2020-11-29: qty 100

## 2020-11-29 MED ORDER — FENTANYL CITRATE (PF) 100 MCG/2ML IJ SOLN
INTRAMUSCULAR | Status: AC
  Filled 2020-11-29: qty 2

## 2020-11-29 MED ORDER — 0.9 % SODIUM CHLORIDE (POUR BTL) OPTIME
TOPICAL | Status: DC | PRN
  Administered 2020-11-29: 08:00:00 1000 mL

## 2020-11-29 MED ORDER — LACTATED RINGERS IV SOLN: INTRAVENOUS | Status: DC

## 2020-11-29 MED ORDER — PHENYLEPHRINE 40 MCG/ML (10ML) SYRINGE FOR IV PUSH (FOR BLOOD PRESSURE SUPPORT)
PREFILLED_SYRINGE | INTRAVENOUS | Status: DC | PRN
  Administered 2020-11-29: 80 ug via INTRAVENOUS
  Administered 2020-11-29 (×3): 40 ug via INTRAVENOUS
  Administered 2020-11-29: 80 ug via INTRAVENOUS
  Administered 2020-11-29 (×2): 40 ug via INTRAVENOUS

## 2020-11-29 MED ORDER — ONDANSETRON HCL 4 MG/2ML IJ SOLN: 4.0000 mg | Freq: Once | INTRAMUSCULAR | Status: DC | PRN

## 2020-11-29 MED ORDER — ASPIRIN EC 81 MG PO TBEC: 81.0000 mg | DELAYED_RELEASE_TABLET | Freq: Every day | ORAL | 0 refills | Status: AC

## 2020-11-29 MED ORDER — AMISULPRIDE (ANTIEMETIC) 5 MG/2ML IV SOLN: 10.0000 mg | Freq: Once | INTRAVENOUS | Status: DC | PRN

## 2020-11-29 MED ORDER — PROPOFOL 500 MG/50ML IV EMUL
INTRAVENOUS | Status: AC
  Filled 2020-11-29: qty 50

## 2020-11-29 MED ORDER — DEXAMETHASONE SODIUM PHOSPHATE 10 MG/ML IJ SOLN
INTRAMUSCULAR | Status: AC
  Filled 2020-11-29: qty 1

## 2020-11-29 MED ORDER — OXYCODONE HCL 5 MG PO TABS: 5.0000 mg | ORAL_TABLET | Freq: Four times a day (QID) | ORAL | 0 refills | Status: AC | PRN

## 2020-11-29 MED ORDER — OXYCODONE HCL 5 MG PO TABS
5.0000 mg | ORAL_TABLET | Freq: Once | ORAL | Status: AC | PRN
  Administered 2020-11-29: 5 mg via ORAL

## 2020-11-29 MED ORDER — CELECOXIB 200 MG PO CAPS
200.0000 mg | ORAL_CAPSULE | Freq: Two times a day (BID) | ORAL | Status: DC
Start: 2020-11-29 — End: 2020-11-29

## 2020-11-29 MED ORDER — LACTATED RINGERS IV BOLUS
250.0000 mL | Freq: Once | INTRAVENOUS | Status: AC
  Administered 2020-11-29: 13:00:00 250 mL via INTRAVENOUS

## 2020-11-29 MED ORDER — DEXAMETHASONE SODIUM PHOSPHATE 10 MG/ML IJ SOLN
INTRAMUSCULAR | Status: DC | PRN
  Administered 2020-11-29: 10 mg via INTRAVENOUS

## 2020-11-29 MED ORDER — SODIUM CHLORIDE FLUSH 0.9 % IV SOLN
INTRAVENOUS | Status: DC | PRN
  Administered 2020-11-29: 10 mL

## 2020-11-29 MED ORDER — ONDANSETRON HCL 4 MG PO TABS: 4.0000 mg | ORAL_TABLET | Freq: Three times a day (TID) | ORAL | 0 refills | Status: AC | PRN

## 2020-11-29 MED ORDER — TRANEXAMIC ACID-NACL 1000-0.7 MG/100ML-% IV SOLN
1000.0000 mg | Freq: Once | INTRAVENOUS | Status: AC
  Administered 2020-11-29: 13:00:00 1000 mg via INTRAVENOUS

## 2020-11-29 MED ORDER — SODIUM CHLORIDE (PF) 0.9 % IJ SOLN
INTRAMUSCULAR | Status: AC
  Filled 2020-11-29: qty 10

## 2020-11-29 SURGICAL SUPPLY — 45 items
BAG ZIPLOCK 12X15 (MISCELLANEOUS) IMPLANT
BLADE SAG 18X100X1.27 (BLADE) ×3 IMPLANT
BLADE SURG SZ10 CARB STEEL (BLADE) IMPLANT
CHLORAPREP W/TINT 26 (MISCELLANEOUS) ×3 IMPLANT
CLOSURE STERI-STRIP 1/2X4 (GAUZE/BANDAGES/DRESSINGS) ×1
CLOSURE WOUND 1/2 X4 (GAUZE/BANDAGES/DRESSINGS) ×1
CLSR STERI-STRIP ANTIMIC 1/2X4 (GAUZE/BANDAGES/DRESSINGS) ×2 IMPLANT
COVER PERINEAL POST (MISCELLANEOUS) ×3 IMPLANT
COVER SURGICAL LIGHT HANDLE (MISCELLANEOUS) ×3 IMPLANT
COVER WAND RF STERILE (DRAPES) IMPLANT
DECANTER SPIKE VIAL GLASS SM (MISCELLANEOUS) ×3 IMPLANT
DRAPE IMP U-DRAPE 54X76 (DRAPES) ×3 IMPLANT
DRAPE STERI IOBAN 125X83 (DRAPES) ×3 IMPLANT
DRAPE U-SHAPE 47X51 STRL (DRAPES) ×6 IMPLANT
DRSG MEPILEX BORDER 4X8 (GAUZE/BANDAGES/DRESSINGS) ×3 IMPLANT
ELECT REM PT RETURN 15FT ADLT (MISCELLANEOUS) ×3 IMPLANT
GLOVE BIO SURGEON STRL SZ7.5 (GLOVE) ×6 IMPLANT
GLOVE BIOGEL PI IND STRL 7.5 (GLOVE) ×1 IMPLANT
GLOVE BIOGEL PI IND STRL 8 (GLOVE) ×1 IMPLANT
GLOVE BIOGEL PI INDICATOR 7.5 (GLOVE) ×2
GLOVE BIOGEL PI INDICATOR 8 (GLOVE) ×2
GOWN STRL REUS W/TWL LRG LVL3 (GOWN DISPOSABLE) ×3 IMPLANT
GOWN STRL REUS W/TWL XL LVL3 (GOWN DISPOSABLE) ×3 IMPLANT
HEAD CERAMIC FEMORAL 36MM (Head) ×3 IMPLANT
HOLDER FOLEY CATH W/STRAP (MISCELLANEOUS) IMPLANT
INSERT TRIDENT POLY 36 0DEG (Insert) ×3 IMPLANT
KIT TURNOVER KIT A (KITS) ×3 IMPLANT
MANIFOLD NEPTUNE II (INSTRUMENTS) ×3 IMPLANT
NS IRRIG 1000ML POUR BTL (IV SOLUTION) ×3 IMPLANT
PACK ANTERIOR HIP CUSTOM (KITS) ×3 IMPLANT
PROTECTOR NERVE ULNAR (MISCELLANEOUS) ×3 IMPLANT
SCREW HEX LP 6.5X20 (Screw) ×3 IMPLANT
SHELL ACETABUL CLUSTER SZ 54 (Shell) ×3 IMPLANT
STEM ACCOLADE SZ 6 (Hips) ×3 IMPLANT
STRIP CLOSURE SKIN 1/2X4 (GAUZE/BANDAGES/DRESSINGS) ×2 IMPLANT
SUT MNCRL AB 3-0 PS2 18 (SUTURE) ×3 IMPLANT
SUT STRATAFIX 0 PDS 27 VIOLET (SUTURE) ×3
SUT VIC AB 0 CT1 36 (SUTURE) ×3 IMPLANT
SUT VIC AB 1 CT1 36 (SUTURE) ×3 IMPLANT
SUT VIC AB 2-0 CT1 27 (SUTURE) ×4
SUT VIC AB 2-0 CT1 TAPERPNT 27 (SUTURE) ×2 IMPLANT
SUTURE STRATFX 0 PDS 27 VIOLET (SUTURE) ×1 IMPLANT
TRAY FOLEY MTR SLVR 14FR STAT (SET/KITS/TRAYS/PACK) ×3 IMPLANT
TUBE SUCTION HIGH CAP CLEAR NV (SUCTIONS) ×3 IMPLANT
WATER STERILE IRR 1000ML POUR (IV SOLUTION) ×6 IMPLANT

## 2020-11-29 NOTE — Evaluation (Signed)
Physical Therapy Evaluation Patient Details Name: Morgan Johnson MRN: [id number] DOB: 06/18/1951 Today's Date: 11/29/2020   History of Present Illness  Patient is 69 y.o. female s/p Rt THA anterior approach on 11/29/20 with PMH significant for RA, OA, HTN, hypothyroidism, glaucoma, depression, COPD, anemia, breast cancer.    Clinical Impression  Morgan Johnson is a 69 y.o. female POD 0 s/p Rt THA. Patient reports independence with use of SCP for mobility at baseline. Patient is now limited by functional impairments (see PT problem list below) and requires min guard for transfers and gait with RW. Patient was able to ambulate ~140 feet with RW and min guard and cues for safe walker management. Patient educated on safe sequencing for stair mobility and verbalized safe guarding position for people assisting with mobility. Patient instructed in exercises to facilitate ROM and circulation. Patient will benefit from continued skilled PT interventions to address impairments and progress towards PLOF. Patient has met mobility goals at adequate level for discharge home; will continue to follow if pt continues acute stay to progress towards Mod I goals.     Follow Up Recommendations Follow surgeon's recommendation for DC plan and follow-up therapies;Outpatient PT    Equipment Recommendations  3in1 (PT)    Recommendations for Other Services       Precautions / Restrictions Precautions Precautions: Fall Restrictions Weight Bearing Restrictions: No Other Position/Activity Restrictions: WBAT      Mobility  Bed Mobility Overal bed mobility: Needs Assistance Bed Mobility: Supine to Sit;Sit to Supine     Supine to sit: Min guard Sit to supine: Min guard   General bed mobility comments: Cues for use of gait belt to assist Rt LE off/on the bed. guarding for safety, pt taking extra time.    Transfers Overall transfer level: Needs assistance Equipment used: Rolling walker (2 wheeled) Transfers:  Sit to/from [facility] Sit to Stand: Min guard Stand pivot transfers: Min guard       General transfer comment: cues for safe technique with RW for power up. guarding for safety with rise.  Ambulation/Gait Ambulation/Gait assistance: Min guard Gait Distance (Feet): 140 Feet Assistive device: Rolling walker (2 wheeled) Gait Pattern/deviations: Step-to pattern;Decreased stride length;Decreased stance time - right;Decreased weight shift to right Gait velocity: decr   General Gait Details: cues for safe step pattern and proximity to RW, intermittent cues required throughout gait. pt's sister present and educated on safe guarding for gait by therapist. no overt LOB noted throughout. Pt reported some lightheadedness and sweaty sensation after stair training.  Stairs Stairs: Yes Stairs assistance: Min guard Stair Management: No rails;Step to pattern;Forwards;With walker Number of Stairs: 2 General stair comments: Cues fors afe sequencing "up with good, down with bad" and for RW position. Assist from pt's sister for guarding and walker management. educated pt/sister on 2nd person to assist for extra safety.  Wheelchair Mobility    Modified Rankin (Stroke Patients Only)       Balance Overall balance assessment: Needs assistance Sitting-balance support: Feet supported;No upper extremity supported Sitting balance-Leahy Scale: Good     Standing balance support: During functional activity;Bilateral upper extremity supported Standing balance-Leahy Scale: Fair                               Pertinent Vitals/Pain Pain Assessment: 0-10 Pain Score: 4  Pain Location: Rt hip Pain Descriptors / Indicators: Aching    Home Living Family/patient expects to be discharged to:: Private  residence Living Arrangements: Other relatives Available Help at Discharge: Family;Available 24 hours/day Type of Home: House Home Access: Stairs to enter Entrance Stairs-Rails:  None Entrance Stairs-Number of Steps: 2 Home Layout: One level Home Equipment: Walker - 2 wheels;Walker - 4 wheels;Cane - single point Additional Comments: Pt is from [facility] and is here to stay with sister as she recovers from surgery    Prior Function Level of Independence: Independent with assistive device(s)   Gait / Transfers Assistance Needed: pt using SPC for gait for about 7 months due to pain           Hand Dominance   Dominant Hand: Right    Extremity/Trunk Assessment   Upper Extremity Assessment Upper Extremity Assessment: Overall WFL for tasks assessed    Lower Extremity Assessment Lower Extremity Assessment: Overall WFL for tasks assessed    Cervical / Trunk Assessment Cervical / Trunk Assessment: Normal  Communication   Communication: No difficulties  Cognition Arousal/Alertness: Awake/alert Behavior During Therapy: WFL for tasks assessed/performed;Anxious Overall Cognitive Status: Within Functional Limits for tasks assessed                                 General Comments: pt's sister present, reports pt has been nervous and anxious about surgery and going home.      General Comments General comments (skin integrity, edema, etc.): BP down from 235'T-732'K diastolic to 02'R diastolic after gait. RN notified and pt/sister edcuated on hypotension signs/symptoms.    Exercises Total Joint Exercises Ankle Circles/Pumps: AROM;Both;20 reps;Supine Quad Sets: AROM;Right;5 reps;Supine Short Arc Quad: AROM;Right;5 reps;Supine Heel Slides: AROM;Right;5 reps;Supine Hip ABduction/ADduction: AROM;Right;5 reps;Supine   Assessment/Plan    PT Assessment Patient needs continued PT services  PT Problem List Decreased strength;Decreased range of motion;Decreased activity tolerance;Decreased balance;Decreased mobility;Decreased knowledge of use of DME;Decreased knowledge of precautions       PT Treatment Interventions DME instruction;Gait training;Stair  training;Functional mobility training;Therapeutic activities;Therapeutic exercise;Balance training;Patient/family education    PT Goals (Current goals can be found in the Care Plan section)  Acute Rehab PT Goals Patient Stated Goal: recover independence PT Goal Formulation: With patient/family Time For Goal Achievement: 12/06/20 Potential to Achieve Goals: Good    Frequency 7X/week   Barriers to discharge        Co-evaluation               AM-PAC PT "6 Clicks" Mobility  Outcome Measure Help needed turning from your back to your side while in a flat bed without using bedrails?: A Little Help needed moving from lying on your back to sitting on the side of a flat bed without using bedrails?: A Little Help needed moving to and from a bed to a chair (including a wheelchair)?: A Little Help needed standing up from a chair using your arms (e.g., wheelchair or bedside chair)?: A Little Help needed to walk in hospital room?: A Little Help needed climbing 3-5 steps with a railing? : A Little 6 Click Score: 18    End of Session Equipment Utilized During Treatment: Gait belt Activity Tolerance: Patient tolerated treatment well Patient left: in bed;with family/visitor present;with call bell/phone within reach Nurse Communication: Mobility status (BP with mobility) PT Visit Diagnosis: Muscle weakness (generalized) (M62.81);Difficulty in walking, not elsewhere classified (R26.2)    Time: 4270-6237 PT Time Calculation (min) (ACUTE ONLY): 46 min   Charges:   PT Evaluation $PT Eval Low Complexity: 1 Low PT Treatments $Gait  Training: 8-22 mins $Therapeutic Exercise: 8-22 mins        Verner Mould, DPT Acute Rehabilitation Services Office [phone] Pager [phone]    Jacques Navy 11/29/2020, 1:59 PM

## 2020-11-29 NOTE — Op Note (Addendum)
11/29/2020  10:39 AM  PATIENT:  Morgan Johnson   MRN: [id number]  PRE-OPERATIVE DIAGNOSIS:  OSTEOARTHRITIS RIGHT HIP  POST-OPERATIVE DIAGNOSIS:  OSTEOARTHRITIS RIGHT HIP  PROCEDURE:  Procedure(s): TOTAL HIP ARTHROPLASTY ANTERIOR APPROACH  PREOPERATIVE INDICATIONS:    Morgan Johnson is an 69 y.o. female who has a diagnosis of <principal problem not specified> and elected for surgical management after failing conservative treatment.  The risks benefits and alternatives were discussed with the patient including but not limited to the risks of nonoperative treatment, versus surgical intervention including infection, bleeding, nerve injury, periprosthetic fracture, the need for revision surgery, dislocation, leg length discrepancy, blood clots, cardiopulmonary complications, morbidity, mortality, among others, and they were willing to proceed.     OPERATIVE REPORT     SURGEON:   Renette Butters, MD    ASSISTANT: Aggie Moats PA-C, he was present and scrubbed throughout the case, critical for completion in a timely fashion, and for retraction, instrumentation, and closure.     ANESTHESIA:  General    COMPLICATIONS:  None.     COMPONENTS:  Stryker acolade fit femur size 6 with a 36 mm -0 head ball and an acetabular shell size 54 with a  polyethylene liner    PROCEDURE IN DETAIL:   The patient was met in the holding area and  identified.  The appropriate hip was identified and marked at the operative site.  The patient was then transported to the OR  and  placed under anesthesia per that record.  At that point, the patient was  placed in the supine position and  secured to the operating room table and all bony prominences padded. He received pre-operative antibiotics    The operative lower extremity was prepped from the iliac crest to the distal leg.  Sterile draping was performed.  Time out was performed prior to incision.      Skin incision was made just 2 cm lateral to the ASIS   extending in line with the tensor fascia lata. Electrocautery was used to control all bleeders. I dissected down sharply to the fascia of the tensor fascia lata was confirmed that the muscle fibers beneath were running posteriorly. I then incised the fascia over the superficial tensor fascia lata in line with the incision. The fascia was elevated off the anterior aspect of the muscle the muscle was retracted posteriorly and protected throughout the case. I then used electrocautery to incise the tensor fascia lata fascia control and all bleeders. Immediately visible was the fat over top of the anterior neck and capsule.  I removed the anterior fat from the capsule and elevated the rectus muscle off of the anterior capsule. I then removed a large time of capsule. The retractors were then placed over the anterior acetabulum as well as around the superior and inferior neck.  I then made a femoral neck cut. Then used the power corkscrew to remove the femoral head from the acetabulum and thoroughly irrigated the acetabulum. I sized the femoral head.    I then exposed the deep acetabulum, cleared out any tissue including the ligamentum teres.   After adequate visualization, I excised the labrum, and then sequentially reamed.  I then impacted the acetabular implant into place using fluoroscopy for guidance.  Appropriate version and inclination was confirmed clinically matching their bony anatomy, and with fluoroscopy.  I placed a 20 mm screw in the posterior/superio position with an excellent bite.    I then placed the polyethylene liner in place  I  then adducted the leg and released the external rotators from the posterior femur allowing it to be easily delivered up lateral and anterior to the acetabulum for preparation of the femoral canal.    I then prepared the proximal femur using the cookie-cutter and then sequentially reamed and broached.  A trial broach, neck, and head was utilized, and I reduced the  hip and used floroscopy to assess the neck length and femoral implant.  I then impacted the femoral prosthesis into place into the appropriate version. The hip was then reduced and fluoroscopy confirmed appropriate position. Leg lengths were restored.  I then irrigated the hip copiously again with, and repaired the fascia with Vicryl, followed by monocryl for the subcutaneous tissue, Monocryl for the skin, Steri-Strips and sterile gauze. The patient was then awakened and returned to PACU in stable and satisfactory condition. There were no complications.  POST OPERATIVE PLAN: WBAT, DVT px: SCD's/TED, ambulation and chemical dvt px  Edmonia Lynch, MD Orthopedic Surgeon [phone]

## 2020-11-29 NOTE — Transfer of Care (Signed)
Immediate Anesthesia Transfer of Care Note  Patient: Morgan Johnson  Procedure(s) Performed: TOTAL HIP ARTHROPLASTY ANTERIOR APPROACH (Right Hip)  Patient Location: PACU  Anesthesia Type:MAC and Spinal  Level of Consciousness: awake, alert  and patient cooperative  Airway & Oxygen Therapy: Patient Spontanous Breathing  Post-op Assessment: Report given to RN and Post -op Vital signs reviewed and stable  Post vital signs: Reviewed and stable  Last Vitals:  Vitals Value Taken Time  BP    Temp    Pulse 71 11/29/20 0928  Resp 15 11/29/20 0928  SpO2 98 % 11/29/20 0928  Vitals shown include unvalidated device data.  Last Pain:  Vitals:   11/29/20 0539  TempSrc: Oral         Complications: No complications documented.

## 2020-11-29 NOTE — Anesthesia Postprocedure Evaluation (Signed)
Anesthesia Post Note  Patient: Morgan Johnson  Procedure(s) Performed: TOTAL HIP ARTHROPLASTY ANTERIOR APPROACH (Right Hip)     Patient location during evaluation: PACU Anesthesia Type: Spinal Level of consciousness: oriented and awake and alert Pain management: pain level controlled Vital Signs Assessment: post-procedure vital signs reviewed and stable Respiratory status: spontaneous breathing and respiratory function stable Cardiovascular status: blood pressure returned to baseline and stable Postop Assessment: no headache, no backache, no apparent nausea or vomiting and patient able to bend at knees Anesthetic complications: no   No complications documented.  Last Vitals:  Vitals:   11/29/20 1045 11/29/20 1130  BP: (!) 166/88 139/85  Pulse: 84 91  Resp: 15 17  Temp:  36.8 C  SpO2: 98% 99%    Last Pain:  Vitals:   11/29/20 1130  TempSrc:   PainSc: 0-No pain                 Mellody Dance

## 2020-11-29 NOTE — Interval H&P Note (Signed)
History and Physical Interval Note:  11/29/2020 7:13 AM  Morgan Johnson  has presented today for surgery, with the diagnosis of OA RIGHT HIP.  The various methods of treatment have been discussed with the patient and family. After consideration of risks, benefits and other options for treatment, the patient has consented to  Procedure(s): TOTAL HIP ARTHROPLASTY ANTERIOR APPROACH (Right) as a surgical intervention.  The patient's history has been reviewed, patient examined, no change in status, stable for surgery.  I have reviewed the patient's chart and labs.  Questions were answered to the patient's satisfaction.     Sheral Apley

## 2020-11-29 NOTE — Anesthesia Procedure Notes (Signed)
Spinal  Patient location during procedure: OR Start time: 11/29/2020 7:30 AM End time: 11/29/2020 7:35 AM Staffing Performed: anesthesiologist  Anesthesiologist: Mellody Dance, MD Preanesthetic Checklist Completed: patient identified, IV checked, risks and benefits discussed, surgical consent, monitors and equipment checked, pre-op evaluation and timeout performed Spinal Block Patient position: sitting Prep: DuraPrep Patient monitoring: cardiac monitor, continuous pulse ox and blood pressure Approach: midline Location: L3-4 Injection technique: single-shot Needle Needle type: Pencan  Needle gauge: 24 G Needle length: 9 cm Additional Notes Functioning IV was confirmed and monitors were applied. Sterile prep and drape, including hand hygiene and sterile gloves were used. The patient was positioned and the spine was prepped. The skin was anesthetized with lidocaine.  Free flow of clear CSF was obtained prior to injecting local anesthetic into the CSF.  The spinal needle aspirated freely following injection.  The needle was carefully withdrawn.  The patient tolerated the procedure well.

## 2020-11-30 ENCOUNTER — Encounter (HOSPITAL_COMMUNITY): Payer: Self-pay | Admitting: Orthopedic Surgery

## 2021-08-24 IMAGING — RF DG SWALLOWING FUNCTION
12 of 15 series · 12 of 24 positions shown · non-contrast
Comparison: None.

CLINICAL DATA: Dysphagia.

EXAM:
MODIFIED BARIUM SWALLOW
TECHNIQUE: Different consistencies of barium were administered orally to the
patient by the Speech Pathologist. Imaging of the pharynx was
performed in the lateral projection. The radiologist was present in
the fluoroscopy room for this study, providing personal supervision.
FLUOROSCOPY TIME:  Fluoroscopy Time:  58 seconds
Number of Acquired Spot Images: 0

[Series 1: run · 1 of 19 frames shown (1 of 12)]
[frame 17/19]
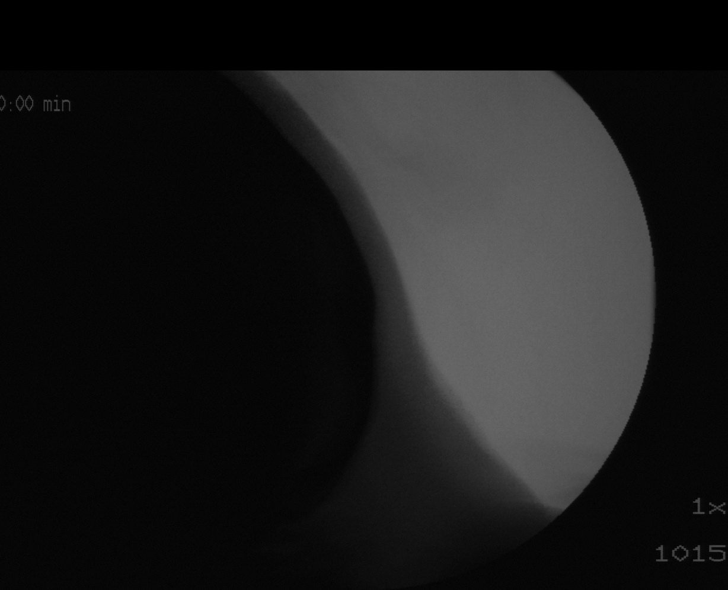

[Series 3: run · 1 of 21 frames shown (2 of 12)]
[frame 2/21]
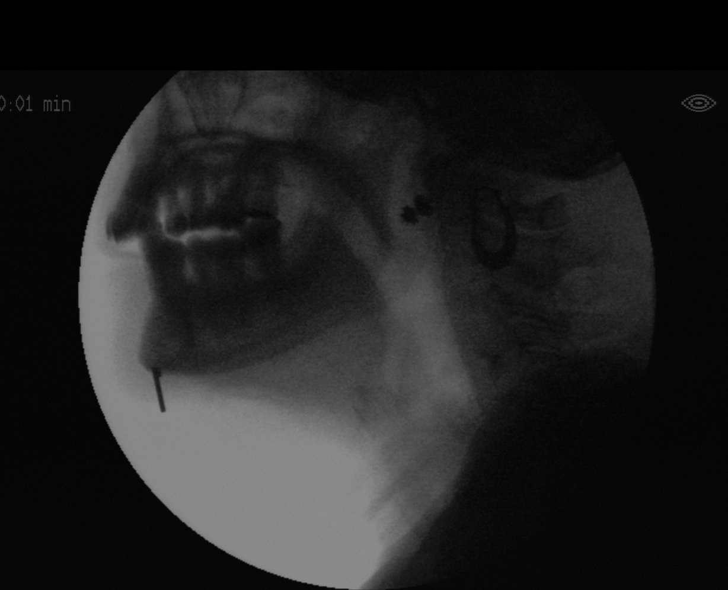

[Series 4: run · 1 of 90 frames shown (3 of 12)]
[frame 46/90]
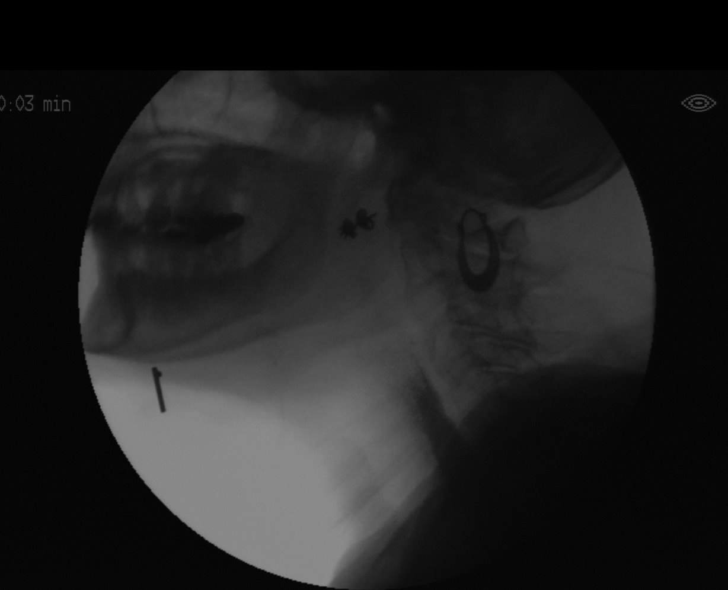

[Series 5: run · 1 of 79 frames shown (4 of 12)]
[frame 56/79]
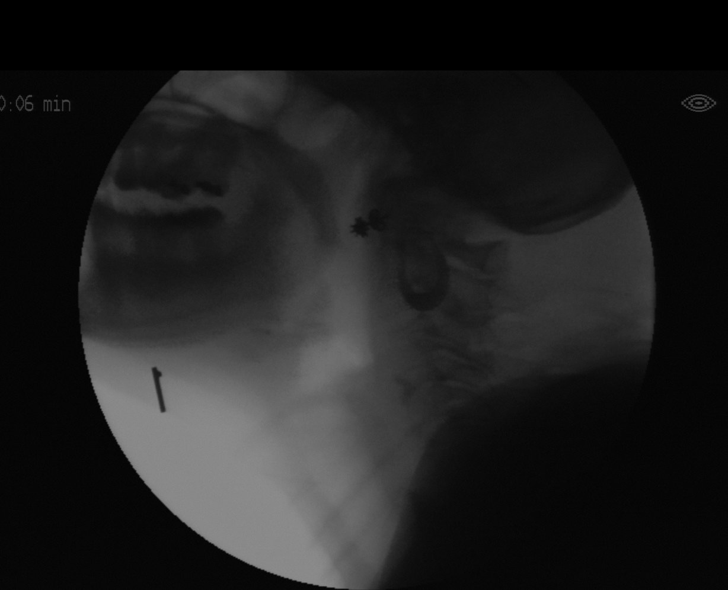

[Series 6: run · 1 of 79 frames shown (5 of 12)]
[frame 68/79]
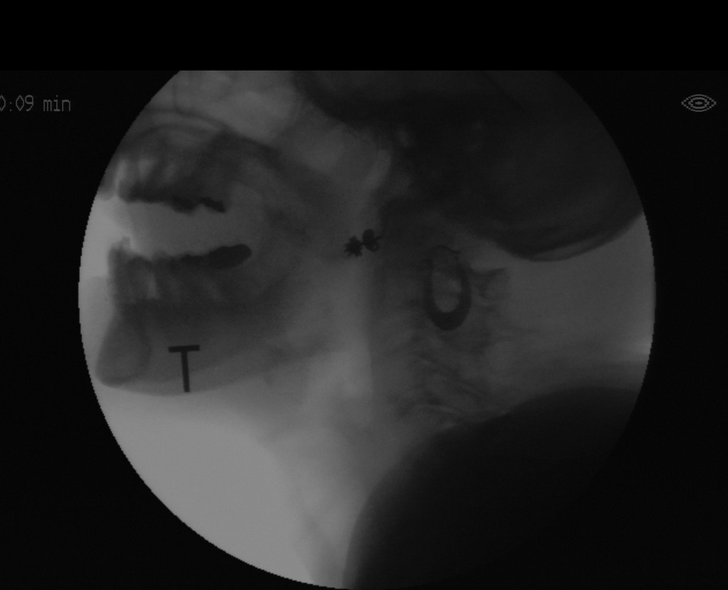

[Series 8: run · 1 of 144 frames shown (6 of 12)]
[frame 22/144]
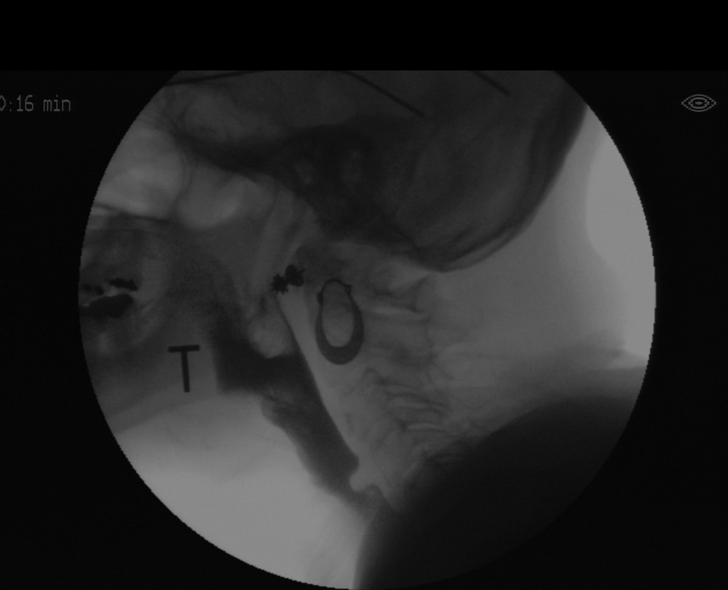

[Series 9: run · 1 of 120 frames shown (7 of 12)]
[frame 19/120]
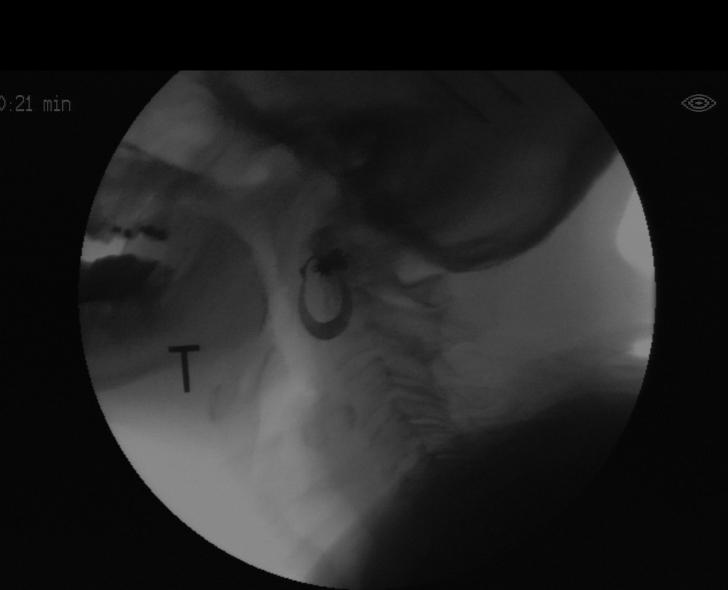

[Series 10: run · 1 of 121 frames shown (8 of 12)]
[frame 61/121]
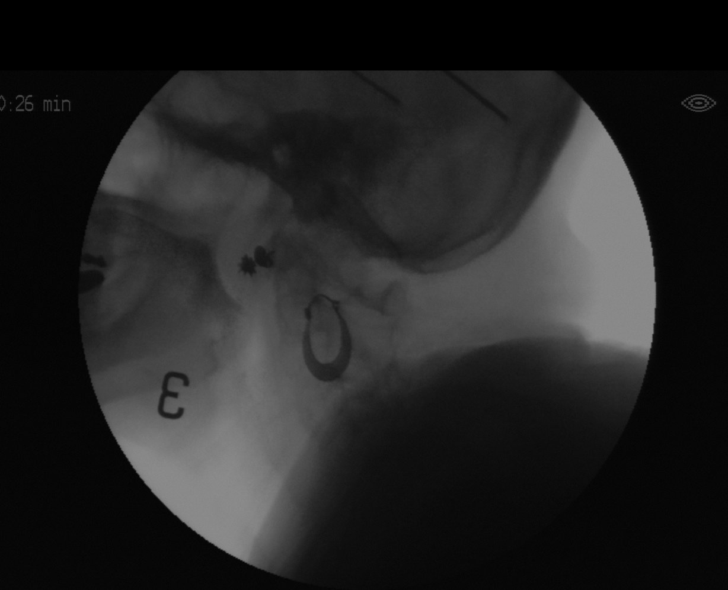

[Series 12: run · 1 of 24 frames shown (9 of 12)]
[frame 4/24]
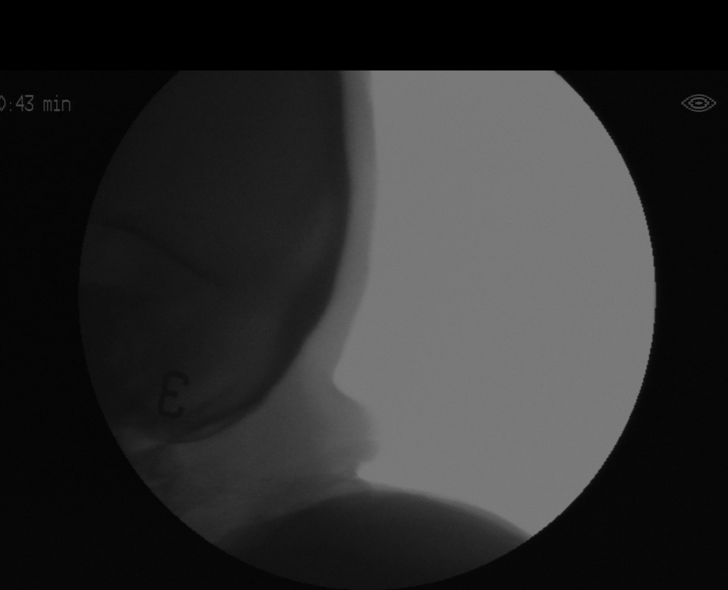

[Series 13: run · 1 of 21 frames shown (10 of 12)]
[frame 11/21]
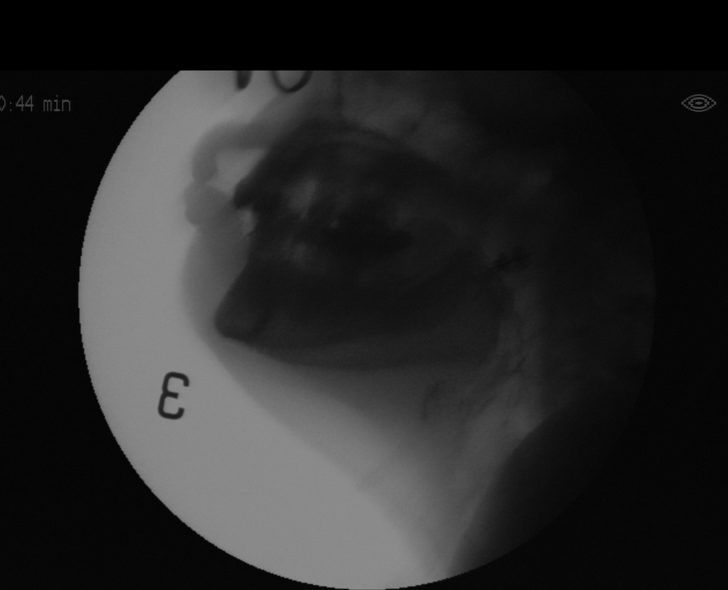

[Series 14: run · 1 of 274 frames shown (11 of 12)]
[frame 138/274]
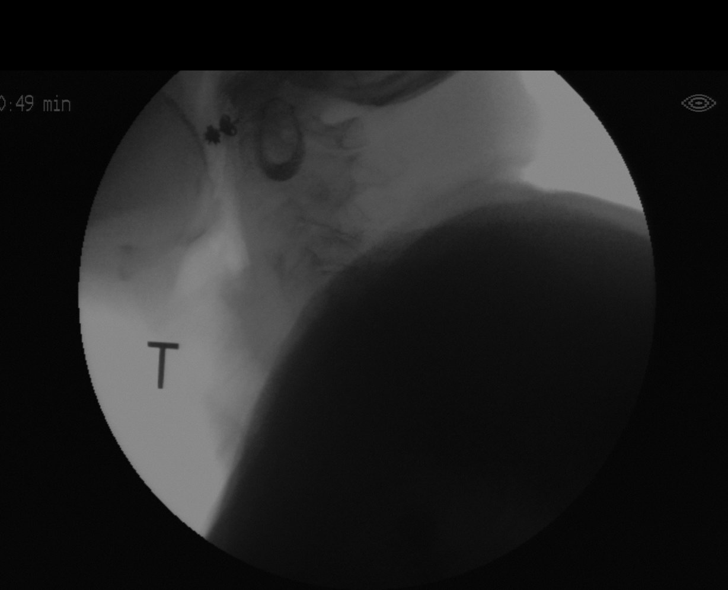

[Series 15: run · 1 of 127 frames shown (12 of 12)]
[frame 108/127]
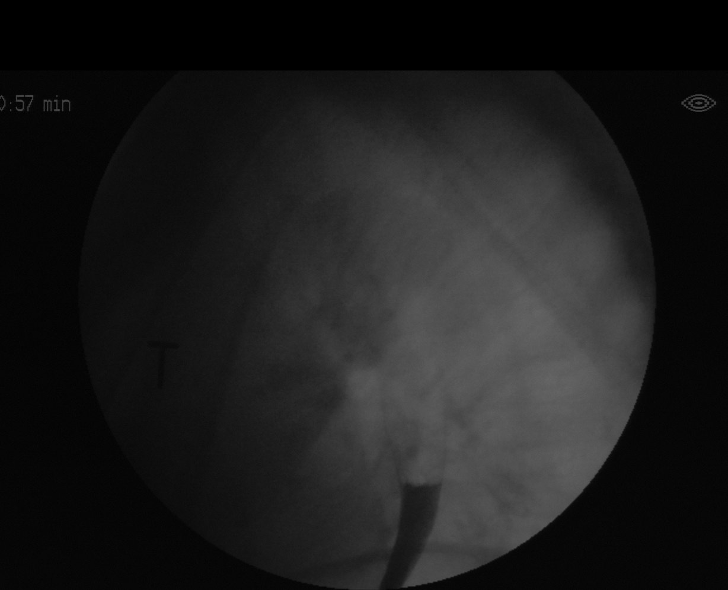

[12 of 24 positions shown; findings below may reference images not displayed]

FINDINGS: There is no significant penetration or aspiration with any attempted
consistencies. There is a prominent cricopharyngeus. Barium pill
passed without difficulty.
IMPRESSION: No aspiration.

Please refer to the Speech Pathologists report for complete details
and recommendations.

## 2021-10-23 IMAGING — RF DG HIP (WITH PELVIS) OPERATIVE*R*
1 series · 4 of 4 positions shown · non-contrast
Comparison: None.

CLINICAL DATA: Right anterior hip replacement.

EXAM:
OPERATIVE RIGHT HIP (WITH PELVIS IF PERFORMED) 4 VIEWS
TECHNIQUE: Fluoroscopic spot image(s) were submitted for interpretation
post-operatively.
FLUOROSCOPY TIME:  7 seconds

[Series 1: unknown protocol · 0.20mm/px · 4 of 4 slices shown]
[im 1/4]
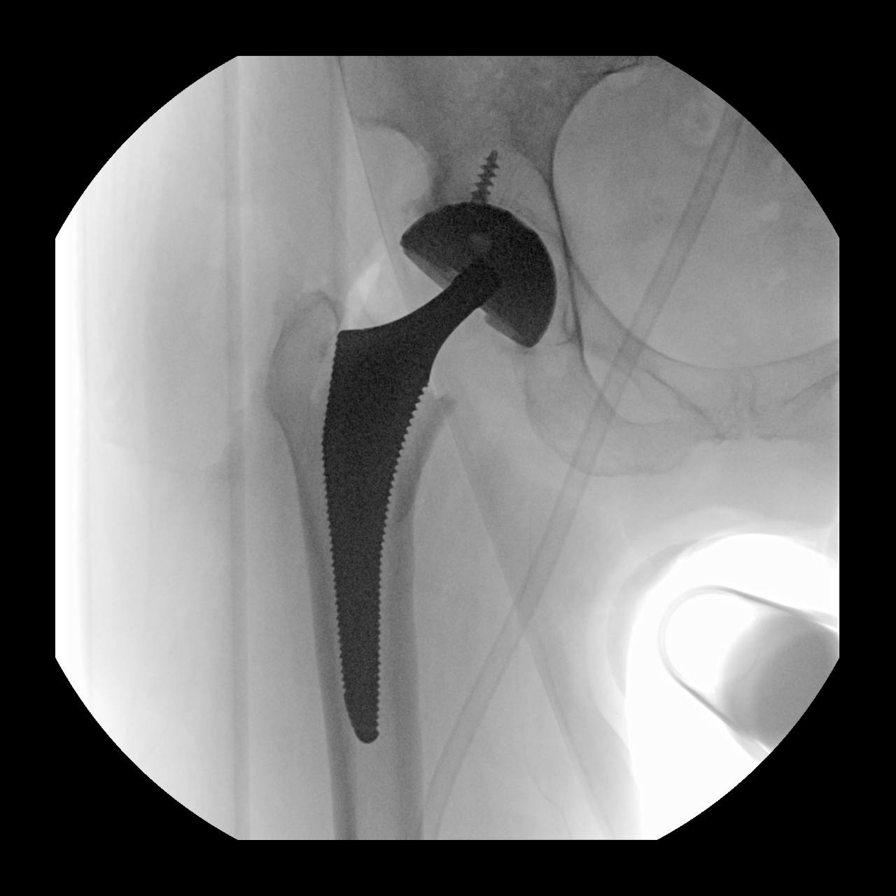
[im 2/4]
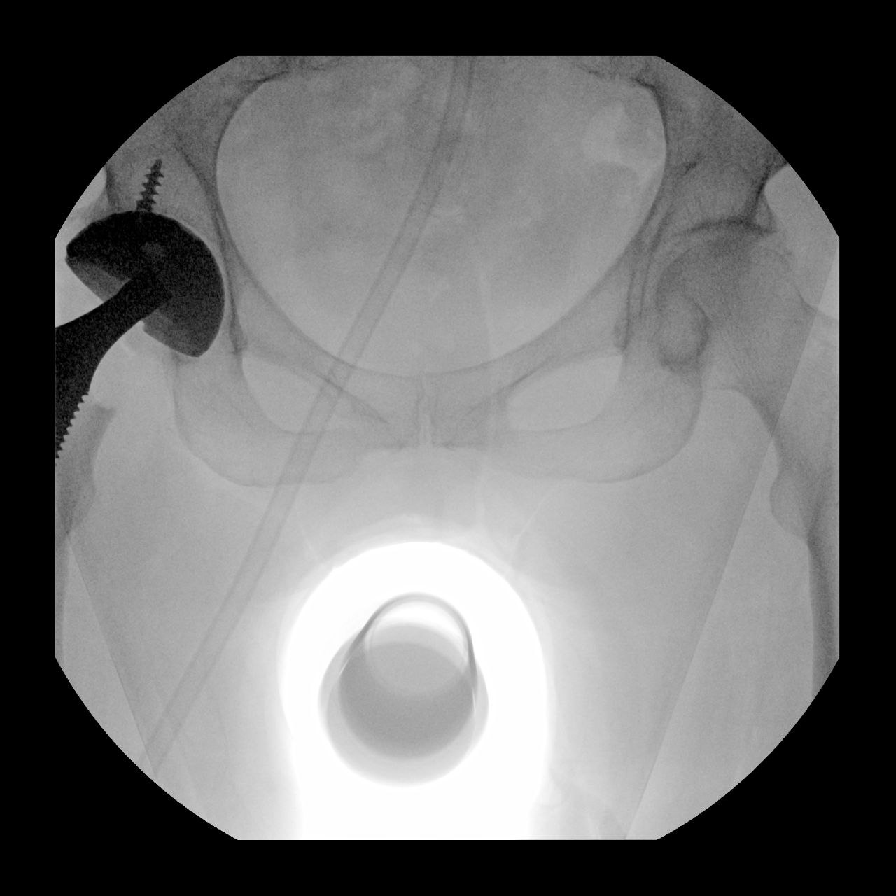
[im 3/4]
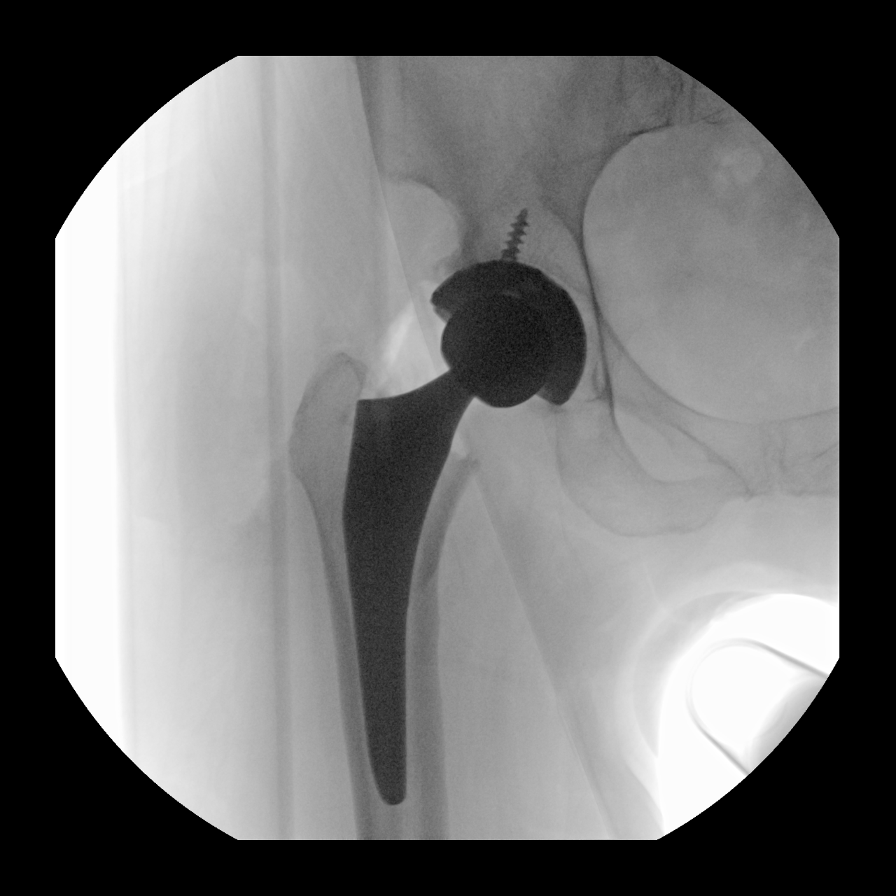
[im 4/4]
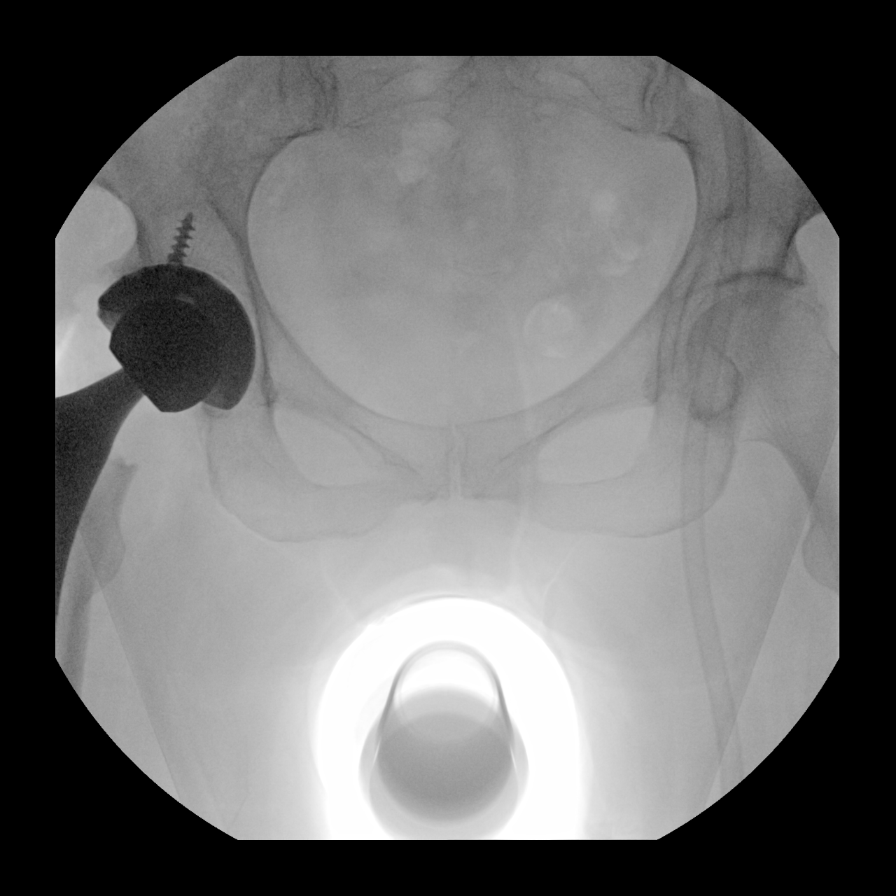

[4 of 4 positions shown; findings below may reference images not displayed]

FINDINGS: Four spot intraoperative fluoroscopic images of the right hip and
lower pelvis are provided for review and demonstrate the sequela of
right total hip replacement. Note is made of a cancellous acetabular
screw. Alignment appears anatomic given AP projection. There is a
minimal amount of expected subcutaneous emphysema about the
operative site. No radiopaque foreign body. No definite fracture.
IMPRESSION: Post right total hip replacement without evidence of complication.

## 2023-09-05 ENCOUNTER — Ambulatory Visit (INDEPENDENT_AMBULATORY_CARE_PROVIDER_SITE_OTHER): Payer: Federal, State, Local not specified - PPO

## 2023-09-05 ENCOUNTER — Ambulatory Visit: Payer: Federal, State, Local not specified - PPO | Admitting: Podiatry

## 2023-09-05 DIAGNOSIS — M79675 Pain in left toe(s): Secondary | ICD-10-CM | POA: Diagnosis not present

## 2023-09-05 DIAGNOSIS — L600 Ingrowing nail: Secondary | ICD-10-CM

## 2023-09-05 NOTE — Progress Notes (Signed)
  Subjective:  Patient ID: Morgan Johnson, female    DOB: 1951/04/10,  MRN: [id number]  Chief Complaint  Patient presents with   Toe Pain    C/o sharp pain to the left 5th toe that radiates to the lateral aspect of foot. Pain is occasionally, No known injuries.     72 y.o. female presents with concern for pain in the left fifth toe.  Pain is primarily at the border of the nail.  She does have toe deformity as well.  Past Medical History:  Diagnosis Date   Alcoholism (HCC)    Anemia    Breast cancer (HCC)    COPD (chronic obstructive pulmonary disease) (HCC)    Depression    Glaucoma    Hypertension    Hypothyroidism    goiter on thyroid   Rheumatoid arthritis (HCC) 2020   Thyroid disease     Allergies  Allergen Reactions   Penicillins Rash    11/29/2020 no issue receiving ancef    ROS: Negative except as per HPI above  Objective:  General: AAO x3, NAD  Dermatological: Ingrown nail on the lateral border of the fifth toenail which is significantly dystrophic and incurvated.  Pain along the nail due to ingrown borders.  Vascular:  Dorsalis Pedis artery and Posterior Tibial artery pedal pulses are 2/4 bilateral.  Capillary fill time < 3 sec to all digits.   Neruologic: Grossly intact via light touch bilateral. Protective threshold intact to all sites bilateral.   Musculoskeletal: Adductovarus rotation of the fifth toe on the left foot  Gait: Unassisted, Nonantalgic.   No images are attached to the encounter.  Radiographs:  Date: 09/05/2023 XR the left foot Weightbearing AP/Lateral/Oblique   Findings: Adductovarus rotation of the fifth toe with significant arthritic changes in the proximal and distal interphalange joint of the left fifth toe Assessment:   1. Ingrown nail of fifth toe of left foot   2. Toe pain, left      Plan:  Patient was evaluated and treated and all questions answered.  Ingrown Nail, left -Patient elects to proceed with minor surgery to  remove ingrown toenail today. Consent reviewed and signed by patient. -Ingrown nail excised. See procedure note. -Educated on post-procedure care including soaking. Written instructions provided and reviewed. -Patient to follow up in 2 weeks for nail check.  Procedure: Excision of Ingrown Toenail Location: Left 5th toe  total  nail  Anesthesia: Lidocaine 1% plain; 1.5 mL and Marcaine 0.5% plain; 1.5 mL, digital block. Skin Prep: Betadine. Dressing: Silvadene; telfa; dry, sterile, compression dressing. Technique: Following skin prep, the toe was exsanguinated and a tourniquet was secured at the base of the toe. The affected nail border was freed, split with a nail splitter, and excised. Chemical matrixectomy was then performed with NaOH and irrigated out with vinegar.. The tourniquet was then removed and sterile dressing applied. Disposition: Patient tolerated procedure well. Patient to return in 2 weeks for follow-up.    Return in about 2 weeks (around 09/19/2023) for Nail check.          Corinna Gab, DPM [facility]

## 2023-09-05 NOTE — Patient Instructions (Signed)

## 2023-10-11 ENCOUNTER — Encounter: Payer: Self-pay | Admitting: Podiatry

## 2023-10-11 ENCOUNTER — Ambulatory Visit (INDEPENDENT_AMBULATORY_CARE_PROVIDER_SITE_OTHER): Payer: Federal, State, Local not specified - PPO

## 2023-10-11 ENCOUNTER — Ambulatory Visit (INDEPENDENT_AMBULATORY_CARE_PROVIDER_SITE_OTHER): Payer: Federal, State, Local not specified - PPO | Admitting: Podiatry

## 2023-10-11 VITALS — Ht 66.0 in | Wt 159.0 lb

## 2023-10-11 DIAGNOSIS — S81802A Unspecified open wound, left lower leg, initial encounter: Secondary | ICD-10-CM

## 2023-10-11 DIAGNOSIS — L03032 Cellulitis of left toe: Secondary | ICD-10-CM | POA: Diagnosis not present

## 2023-10-11 MED ORDER — DOXYCYCLINE HYCLATE 100 MG PO TABS: 100.0000 mg | ORAL_TABLET | Freq: Two times a day (BID) | ORAL | 0 refills | Status: AC

## 2023-10-11 MED ORDER — SILVER SULFADIAZINE 1 % EX CREA: 1.0000 | TOPICAL_CREAM | Freq: Every day | CUTANEOUS | 0 refills | Status: AC

## 2023-10-11 NOTE — Progress Notes (Signed)
Chief Complaint  Patient presents with   Foot Pain    Patient is here for left 5th toe pain States hard and painful    HPI: 72 y.o. female presents today for follow-up evaluation of left fifth toe nail avulsion site.  The left fifth toenail was removed on 10/3 by Dr. Annamary Rummage.  She presents today with pain and swelling to the left fifth toe with some associated redness.  There is some loosely adhered scab to the nail avulsion site.  Patient denies nausea, vomiting, fever, chills, chest pain, shortness of breath.  Past Medical History:  Diagnosis Date   Alcoholism (HCC)    Anemia    Breast cancer (HCC)    COPD (chronic obstructive pulmonary disease) (HCC)    Depression    Glaucoma    Hypertension    Hypothyroidism    goiter on thyroid   Rheumatoid arthritis (HCC) 2020   Thyroid disease     Past Surgical History:  Procedure Laterality Date   MASTECTOMY Left 11/06/1985   TOTAL ABDOMINAL HYSTERECTOMY     TOTAL HIP ARTHROPLASTY Right 11/29/2020   Procedure: TOTAL HIP ARTHROPLASTY ANTERIOR APPROACH;  Surgeon: Sheral Apley, MD;  Location: [facility];  Service: Orthopedics;  Laterality: Right;    Allergies  Allergen Reactions   Penicillins Rash    11/29/2020 no issue receiving ancef    ROS -negative except as stated in HPI   Physical Exam: There were no vitals filed for this visit.  General: The patient is alert and oriented x3 in no acute distress.  Dermatology: Left fifth toe is tender on palpation.  Associated edema present with some mild erythema.  Loosely adhered scab to the left fifth nailbed. This was removed today. Underlying tissue appears fibrotic. No purulence.  Vascular: Palpable pedal pulses bilaterally. Capillary refill within normal limits.  No appreciable edema.  No erythema or calor.  Neurological: Light touch sensation grossly intact bilateral feet.   Musculoskeletal Exam: Adductovarus fifth digits appreciated bilaterally.  Radiographic  Exam:  Normal osseous mineralization. Joint spaces preserved.  No fractures or osseous irregularities noted.  Adductovarus fifth toe appreciated.  No osteolysis or cortical erosion appreciated.  Assessment/Plan of Care: 1. Cellulitis of left toe   2. Non-healing wound of left lower extremity      Meds ordered this encounter  Medications   doxycycline (VIBRA-TABS) 100 MG tablet    Sig: Take 1 tablet (100 mg total) by mouth 2 (two) times daily for 10 days.    Dispense:  20 tablet    Refill:  0   silver sulfADIAZINE (SILVADENE) 1 % cream    Sig: Apply 1 Application topically daily.    Dispense:  50 g    Refill:  0   None  Discussed clinical findings with patient today.  # Nonhealing left fifth toenail avulsion site with associated cellulitis -Starting patient on 2 weeks of oral doxycycline for MRSA coverage.  No culture obtained due to lack of drainage. -No signs of osteomyelitis on radiographs, deep infection not suspected. -The avulsion site dressed with Silvadene.  Silvadene prescription written for the patient to be applied daily. -Advised patient to keep toe clean and dry. -I advised 2-week follow-up to the patient.  She states that she lives out of town and is only here when she is visiting her daughter which is roughly once a month.  I advised that she try and follow-up with a provider local to her if she does not  notice significant improvement or healing to the left 5th toe -Discussed signs and symptoms of infection including worsening pain, redness, swelling, purulent drainage or systemic signs of infection in which case she should contact the office immediately or seek medical care.   Vineet Kinney L. Marchia Bond, AACFAS [facility]     [address] [place], [place] [address]                Office [phone]  Fax [phone]
